# Patient Record
Sex: Male | Born: 1990 | Race: Black or African American | Hispanic: No | Marital: Single | Smoking: Never smoker
Health system: Southern US, Community
[De-identification: ages and names within clinical notes are randomized; demographics above are authoritative.]

## PROBLEM LIST (undated history)

## (undated) DIAGNOSIS — J45909 Unspecified asthma, uncomplicated: Secondary | ICD-10-CM

## (undated) DIAGNOSIS — U071 COVID-19: Secondary | ICD-10-CM

## (undated) HISTORY — DX: Unspecified asthma, uncomplicated: J45.909

---

## 2019-02-01 ENCOUNTER — Other Ambulatory Visit: Payer: Self-pay

## 2019-02-01 ENCOUNTER — Encounter (HOSPITAL_COMMUNITY): Payer: Self-pay

## 2019-02-01 ENCOUNTER — Emergency Department (HOSPITAL_COMMUNITY)
Admission: EM | Admit: 2019-02-01 | Discharge: 2019-02-02 | Disposition: A | Discharge status: Home / Self Care | Payer: 59 | Attending: Emergency Medicine | Admitting: Emergency Medicine

## 2019-02-01 DIAGNOSIS — Z20828 Contact with and (suspected) exposure to other viral communicable diseases: Secondary | ICD-10-CM | POA: Insufficient documentation

## 2019-02-01 DIAGNOSIS — R42 Dizziness and giddiness: Secondary | ICD-10-CM

## 2019-02-01 DIAGNOSIS — B349 Viral infection, unspecified: Secondary | ICD-10-CM | POA: Diagnosis not present

## 2019-02-01 DIAGNOSIS — I951 Orthostatic hypotension: Secondary | ICD-10-CM | POA: Diagnosis not present

## 2019-02-01 LAB — CBC
HCT: 41.8 % (ref 39.0–52.0)
Hemoglobin: 13.5 g/dL (ref 13.0–17.0)
MCH: 29.6 pg (ref 26.0–34.0)
MCHC: 32.3 g/dL (ref 30.0–36.0)
MCV: 91.7 fL (ref 80.0–100.0)
Platelets: 258 10*3/uL (ref 150–400)
RBC: 4.56 MIL/uL (ref 4.22–5.81)
RDW: 13.2 % (ref 11.5–15.5)
WBC: 5.5 10*3/uL (ref 4.0–10.5)
nRBC: 0 % (ref 0.0–0.2)

## 2019-02-01 LAB — URINALYSIS, ROUTINE W REFLEX MICROSCOPIC
Bilirubin Urine: NEGATIVE
Glucose, UA: NEGATIVE mg/dL
Hgb urine dipstick: NEGATIVE
Ketones, ur: NEGATIVE mg/dL
Leukocytes,Ua: NEGATIVE
Nitrite: NEGATIVE
Protein, ur: NEGATIVE mg/dL
Specific Gravity, Urine: 1.009 (ref 1.005–1.030)
pH: 6 (ref 5.0–8.0)

## 2019-02-01 LAB — BASIC METABOLIC PANEL
Anion gap: 11 (ref 5–15)
BUN: 7 mg/dL (ref 6–20)
CO2: 23 mmol/L (ref 22–32)
Calcium: 8.7 mg/dL — ABNORMAL LOW (ref 8.9–10.3)
Chloride: 101 mmol/L (ref 98–111)
Creatinine, Ser: 1.18 mg/dL (ref 0.61–1.24)
GFR calc Af Amer: >60 mL/min (ref >60)
GFR calc non Af Amer: >60 mL/min (ref >60)
Glucose, Bld: 115 mg/dL — ABNORMAL HIGH (ref 70–99)
Potassium: 3.8 mmol/L (ref 3.5–5.1)
Sodium: 135 mmol/L (ref 135–145)

## 2019-02-01 MED ORDER — SODIUM CHLORIDE 0.9% FLUSH: 3.0000 mL | Freq: Once | INTRAVENOUS | Status: DC

## 2019-02-01 NOTE — ED Triage Notes (Signed)
Pt reports recent visit to [facility] on Sunday for rash, pt told he had strept but test was negative, given rx for amoxicillin and ever since pt has felt fatigued, dizzy and has had a dry mouth. Pt taking tylenol and benadryl OTC for symptoms. Pt a.o, nad noted.

## 2019-02-02 NOTE — Discharge Instructions (Addendum)
We have that the outpatient COVID-19 test.  Results will take about 48 hours. Please return to the ER if your symptoms worsen; you have increased pain, fevers, chills, inability to keep any medications down, severe shortness of breath, confusion. Otherwise see the outpatient doctor as requested.   Person Under Monitoring Name: Tim Delgado Iams  Location: [address] [place] [id number]   Infection Prevention Recommendations for Individuals Confirmed to have, or Being Evaluated for, 2019 Novel Coronavirus (COVID-19) Infection Who Receive Care at Home  Individuals who are confirmed to have, or are being evaluated for, COVID-19 should follow the prevention steps below until a healthcare provider or local or state health department says they can return to normal activities.  Stay home except to get medical care You should restrict activities outside your home, except for getting medical care. Do not go to work, school, or public areas, and do not use public transportation or taxis.  Call ahead before visiting your doctor Before your medical appointment, call the healthcare provider and tell them that you have, or are being evaluated for, COVID-19 infection. This will help the healthcare providers office take steps to keep other people from getting infected. Ask your healthcare provider to call the local or state health department.  Monitor your symptoms Seek prompt medical attention if your illness is worsening (e.g., difficulty breathing). Before going to your medical appointment, call the healthcare provider and tell them that you have, or are being evaluated for, COVID-19 infection. Ask your healthcare provider to call the local or state health department.  Wear a facemask You should wear a facemask that covers your nose and mouth when you are in the same room with other people and when you visit a healthcare provider. People who live with or visit you should also  wear a facemask while they are in the same room with you.  Separate yourself from other people in your home As much as possible, you should stay in a different room from other people in your home. Also, you should use a separate bathroom, if available.  Avoid sharing household items You should not share dishes, drinking glasses, cups, eating utensils, towels, bedding, or other items with other people in your home. After using these items, you should wash them thoroughly with soap and water.  Cover your coughs and sneezes Cover your mouth and nose with a tissue when you cough or sneeze, or you can cough or sneeze into your sleeve. Throw used tissues in a lined trash can, and immediately wash your hands with soap and water for at least 20 seconds or use an alcohol-based hand rub.  Wash your Union Pacific Corporationhands Wash your hands often and thoroughly with soap and water for at least 20 seconds. You can use an alcohol-based hand sanitizer if soap and water are not available and if your hands are not visibly dirty. Avoid touching your eyes, nose, and mouth with unwashed hands.   Prevention Steps for Caregivers and Household Members of Individuals Confirmed to have, or Being Evaluated for, COVID-19 Infection Being Cared for in the Home  If you live with, or provide care at home for, a person confirmed to have, or being evaluated for, COVID-19 infection please follow these guidelines to prevent infection:  Follow healthcare providers instructions Make sure that you understand and can help the patient follow any healthcare provider instructions for all care.  Provide for the patients basic needs You should help the patient with basic needs in  the home and provide support for getting groceries, prescriptions, and other personal needs.  Monitor the patients symptoms If they are getting sicker, call his or her medical provider and tell them that the patient has, or is being evaluated for, COVID-19  infection. This will help the healthcare providers office take steps to keep other people from getting infected. Ask the healthcare provider to call the local or state health department.  Limit the number of people who have contact with the patient If possible, have only one caregiver for the patient. Other household members should stay in another home or place of residence. If this is not possible, they should stay in another room, or be separated from the patient as much as possible. Use a separate bathroom, if available. Restrict visitors who do not have an essential need to be in the home.  Keep older adults, very young children, and other sick people away from the patient Keep older adults, very young children, and those who have compromised immune systems or chronic health conditions away from the patient. This includes people with chronic heart, lung, or kidney conditions, diabetes, and cancer.  Ensure good ventilation Make sure that shared spaces in the home have good air flow, such as from an air conditioner or an opened window, weather permitting.  Wash your hands often Wash your hands often and thoroughly with soap and water for at least 20 seconds. You can use an alcohol based hand sanitizer if soap and water are not available and if your hands are not visibly dirty. Avoid touching your eyes, nose, and mouth with unwashed hands. Use disposable paper towels to dry your hands. If not available, use dedicated cloth towels and replace them when they become wet.  Wear a facemask and gloves Wear a disposable facemask at all times in the room and gloves when you touch or have contact with the patients blood, body fluids, and/or secretions or excretions, such as sweat, saliva, sputum, nasal mucus, vomit, urine, or feces.  Ensure the mask fits over your nose and mouth tightly, and do not touch it during use. Throw out disposable facemasks and gloves after using them. Do not reuse. Wash  your hands immediately after removing your facemask and gloves. If your personal clothing becomes contaminated, carefully remove clothing and launder. Wash your hands after handling contaminated clothing. Place all used disposable facemasks, gloves, and other waste in a lined container before disposing them with other household waste. Remove gloves and wash your hands immediately after handling these items.  Do not share dishes, glasses, or other household items with the patient Avoid sharing household items. You should not share dishes, drinking glasses, cups, eating utensils, towels, bedding, or other items with a patient who is confirmed to have, or being evaluated for, COVID-19 infection. After the person uses these items, you should wash them thoroughly with soap and water.  Wash laundry thoroughly Immediately remove and wash clothes or bedding that have blood, body fluids, and/or secretions or excretions, such as sweat, saliva, sputum, nasal mucus, vomit, urine, or feces, on them. Wear gloves when handling laundry from the patient. Read and follow directions on labels of laundry or clothing items and detergent. In general, wash and dry with the warmest temperatures recommended on the label.  Clean all areas the individual has used often Clean all touchable surfaces, such as counters, tabletops, doorknobs, bathroom fixtures, toilets, phones, keyboards, tablets, and bedside tables, every day. Also, clean any surfaces that may have blood, body fluids,  and/or secretions or excretions on them. Wear gloves when cleaning surfaces the patient has come in contact with. Use a diluted bleach solution (e.g., dilute bleach with 1 part bleach and 10 parts water) or a household disinfectant with a label that says EPA-registered for coronaviruses. To make a bleach solution at home, add 1 tablespoon of bleach to 1 quart (4 cups) of water. For a larger supply, add  cup of bleach to 1 gallon (16 cups) of  water. Read labels of cleaning products and follow recommendations provided on product labels. Labels contain instructions for safe and effective use of the cleaning product including precautions you should take when applying the product, such as wearing gloves or eye protection and making sure you have good ventilation during use of the product. Remove gloves and wash hands immediately after cleaning.  Monitor yourself for signs and symptoms of illness Caregivers and household members are considered close contacts, should monitor their health, and will be asked to limit movement outside of the home to the extent possible. Follow the monitoring steps for close contacts listed on the symptom monitoring form.   ? If you have additional questions, contact your local health department or call the epidemiologist on call at [phone] (available 24/7). ? This guidance is subject to change. For the most up-to-date guidance from [facility], please refer to their website: [email or web address]

## 2019-02-02 NOTE — ED Provider Notes (Signed)
[facility] EMERGENCY DEPARTMENT Provider Note   CSN: [id number] Arrival date & time: 02/01/19  2003     History   Chief Complaint Chief Complaint  Patient presents with  . Dizziness  . Medication Reaction    HPI Tim Delgado is a 28 y.o. male.     HPI  28 year old male comes in a chief complaint of dizziness. Patient reports that he is been feeling unwell for 4 days now.  Patient had presented to an urgent care with a rash and he was told that he had a strep infection and started on amoxicillin.  Since then patient has been having body aches, dizziness and feeling fatigued.  He also states that he has been peeing a lot and drinking a lot of water.  He denies any diarrhea, vomiting, anosmia, new rash, sore throat, severe headaches.  He works in [employer] research (medical) but denies any known sick contacts with COVID-19.  History reviewed. No pertinent past medical history.  There are no active problems to display for this patient.   History reviewed. No pertinent surgical history.      Home Medications    Prior to Admission medications   Not on File    Family History No family history on file.  Social History Social History   Tobacco Use  . Smoking status: Not on file  Substance Use Topics  . Alcohol use: Not on file  . Drug use: Not on file     Allergies   Patient has no allergy information on record.   Review of Systems Review of Systems  Constitutional: Positive for activity change, fatigue and fever.  HENT: Positive for sore throat.   Respiratory: Positive for cough. Negative for shortness of breath.   Cardiovascular: Negative for chest pain.  Gastrointestinal: Negative for nausea and vomiting.  Genitourinary: Positive for frequency.  Musculoskeletal: Positive for myalgias.  Allergic/Immunologic: Negative for immunocompromised state.  Hematological: Does not bruise/bleed easily.  All other systems reviewed and are negative.    Physical Exam  Updated Vital Signs BP 110/73 (BP Location: Left Arm)   Pulse 95   Temp (!) 100.7 F (38.2 C) (Oral)   Resp 18   SpO2 97%   Physical Exam Vitals signs and nursing note reviewed.  Constitutional:      Appearance: He is well-developed.  HENT:     Head: Atraumatic.     Mouth/Throat:     Mouth: Mucous membranes are dry.  Neck:     Musculoskeletal: Neck supple.  Cardiovascular:     Rate and Rhythm: Normal rate.  Pulmonary:     Effort: Pulmonary effort is normal.  Abdominal:     Palpations: Abdomen is soft.     Tenderness: There is no abdominal tenderness.  Musculoskeletal:        General: No swelling or tenderness.  Skin:    General: Skin is warm.     Findings: No rash.  Neurological:     Mental Status: He is alert and oriented to person, place, and time.      ED Treatments / Results  Labs (all labs ordered are listed, but only abnormal results are displayed) Labs Reviewed  BASIC METABOLIC PANEL - Abnormal; Notable for the following components:      Result Value   Glucose, Bld 115 (*)    Calcium 8.7 (*)    All other components within normal limits  NOVEL CORONAVIRUS, NAA (HOSPITAL ORDER, SEND-OUT TO REF LAB)  CBC  URINALYSIS, ROUTINE W REFLEX  MICROSCOPIC    EKG None  Radiology No results found.  Procedures Procedures (including critical care time)  Medications Ordered in ED Medications  sodium chloride flush (NS) 0.9 % injection 3 mL (has no administration in time range)     Initial Impression / Assessment and Plan / ED Course  I have reviewed the triage vital signs and the nursing notes.  Pertinent labs & imaging results that were available during my care of the patient were reviewed by me and considered in my medical decision making (see chart for details).        28 year old comes with a chief complaint of dizziness, fatigue, some URI-like symptoms and urinary frequency.  He has a low-grade fever here.  Patient denies any COVID-19 exposures.   He is on amoxicillin for presumed strep throat by urgent care.  Cardiopulmonary exam is normal.  He is appearing slightly dry, but the labs are reassuring.  We will get an outpatient COVID-19 test with this week symptoms along with fevers.   Tim Delgado was evaluated in Emergency Department on 02/02/2019 for the symptoms described in the history of present illness. He was evaluated in the context of the global COVID-19 pandemic, which necessitated consideration that the patient might be at risk for infection with the SARS-CoV-2 virus that causes COVID-19. Institutional protocols and algorithms that pertain to the evaluation of patients at risk for COVID-19 are in a state of rapid change based on information released by regulatory bodies including the CDC and federal and state organizations. These policies and algorithms were followed during the patient's care in the ED.   Final Clinical Impressions(s) / ED Diagnoses   Final diagnoses:  Orthostatic dizziness  Acute viral syndrome    ED Discharge Orders    None       Derwood KaplanNanavati, Vanetta Rule, MD 02/02/19 [phone]

## 2019-02-05 ENCOUNTER — Encounter (HOSPITAL_COMMUNITY): Payer: Self-pay

## 2019-02-05 ENCOUNTER — Emergency Department (HOSPITAL_COMMUNITY): Payer: 59

## 2019-02-05 ENCOUNTER — Emergency Department (HOSPITAL_COMMUNITY)
Admission: EM | Admit: 2019-02-05 | Discharge: 2019-02-05 | Disposition: A | Discharge status: Home / Self Care | Payer: 59 | Attending: Emergency Medicine | Admitting: Emergency Medicine

## 2019-02-05 ENCOUNTER — Other Ambulatory Visit: Payer: Self-pay

## 2019-02-05 DIAGNOSIS — U071 COVID-19: Secondary | ICD-10-CM | POA: Diagnosis not present

## 2019-02-05 DIAGNOSIS — R509 Fever, unspecified: Secondary | ICD-10-CM | POA: Diagnosis present

## 2019-02-05 HISTORY — DX: COVID-19: U07.1

## 2019-02-05 LAB — COMPREHENSIVE METABOLIC PANEL
ALT: 196 U/L — ABNORMAL HIGH (ref 0–44)
AST: 301 U/L — ABNORMAL HIGH (ref 15–41)
Albumin: 4 g/dL (ref 3.5–5.0)
Alkaline Phosphatase: 123 U/L (ref 38–126)
Anion gap: 13 (ref 5–15)
BUN: 15 mg/dL (ref 6–20)
CO2: 25 mmol/L (ref 22–32)
Calcium: 8.6 mg/dL — ABNORMAL LOW (ref 8.9–10.3)
Chloride: 98 mmol/L (ref 98–111)
Creatinine, Ser: 1.15 mg/dL (ref 0.61–1.24)
GFR calc Af Amer: >60 mL/min (ref >60)
GFR calc non Af Amer: >60 mL/min (ref >60)
Glucose, Bld: 121 mg/dL — ABNORMAL HIGH (ref 70–99)
Potassium: 3.8 mmol/L (ref 3.5–5.1)
Sodium: 136 mmol/L (ref 135–145)
Total Bilirubin: 0.5 mg/dL (ref 0.3–1.2)
Total Protein: 8.4 g/dL — ABNORMAL HIGH (ref 6.5–8.1)

## 2019-02-05 LAB — CBC WITH DIFFERENTIAL/PLATELET
Abs Immature Granulocytes: 0.01 10*3/uL (ref 0.00–0.07)
Basophils Absolute: 0 10*3/uL (ref 0.0–0.1)
Basophils Relative: 1 %
Eosinophils Absolute: 0 10*3/uL (ref 0.0–0.5)
Eosinophils Relative: 0 %
HCT: 47.1 % (ref 39.0–52.0)
Hemoglobin: 15.3 g/dL (ref 13.0–17.0)
Immature Granulocytes: 0 %
Lymphocytes Relative: 40 %
Lymphs Abs: 1.4 10*3/uL (ref 0.7–4.0)
MCH: 29.2 pg (ref 26.0–34.0)
MCHC: 32.5 g/dL (ref 30.0–36.0)
MCV: 89.9 fL (ref 80.0–100.0)
Monocytes Absolute: 0.1 10*3/uL (ref 0.1–1.0)
Monocytes Relative: 4 %
Neutro Abs: 1.9 10*3/uL (ref 1.7–7.7)
Neutrophils Relative %: 55 %
Platelets: 164 10*3/uL (ref 150–400)
RBC: 5.24 MIL/uL (ref 4.22–5.81)
RDW: 13.2 % (ref 11.5–15.5)
WBC: 3.4 10*3/uL — ABNORMAL LOW (ref 4.0–10.5)
nRBC: 0 % (ref 0.0–0.2)

## 2019-02-05 LAB — LACTIC ACID, PLASMA: Lactic Acid, Venous: 1.3 mmol/L (ref 0.5–1.9)

## 2019-02-05 MED ORDER — ONDANSETRON 8 MG PO TBDP: 8.0000 mg | ORAL_TABLET | Freq: Three times a day (TID) | ORAL | 0 refills | Status: DC | PRN

## 2019-02-05 MED ORDER — SODIUM CHLORIDE 0.9 % IV BOLUS
2000.0000 mL | Freq: Once | INTRAVENOUS | Status: AC
  Administered 2019-02-05: 2000 mL via INTRAVENOUS

## 2019-02-05 NOTE — Discharge Instructions (Signed)
Your liver function studies were elevated today.  Please avoid taking Tylenol and follow-up with your doctor for repeat blood work in a week.

## 2019-02-05 NOTE — ED Triage Notes (Signed)
Pt was tested for COVID yesterday and results were positive. Pt has a mild fever, body aches, and generalized weakness. Per EMS, pt states he "couldn't walk from being so weak", however, pt walked from his house to the ambulance without assistance, and pt was able to walk from the stretcher to treatment room.

## 2019-02-05 NOTE — ED Provider Notes (Signed)
[facility]-EMERGENCY DEPT Provider Note   CSN: [id number] Arrival date & time: 02/05/19  1524     History   Chief Complaint Chief Complaint  Patient presents with  . Fever    COVID + test yesterday    HPI Tim Delgado is a 28 y.o. male.     28 year old male here presents with fever and chills as well as weakness.  Was diagnosed with COVID-19 on July 9.  Since that time he has had diffuse weakness with cough and some dyspnea.  No emesis or diarrhea.  Has had body aches.  No sore throat.  He has had lack of appetite.  His symptoms have been progressively worse but have not been associated with headache or photophobia.  No treatment use prior to arrival.     Past Medical History:  Diagnosis Date  . COVID-19     There are no active problems to display for this patient.   History reviewed. No pertinent surgical history.      Home Medications    Prior to Admission medications   Not on File    Family History No family history on file.  Social History Social History   Tobacco Use  . Smoking status: Never Smoker  . Smokeless tobacco: Never Used  Substance Use Topics  . Alcohol use: Yes    Alcohol/week: 4.0 standard drinks    Types: 4 Glasses of wine per week  . Drug use: Never     Allergies   Patient has no known allergies.   Review of Systems Review of Systems  All other systems reviewed and are negative.    Physical Exam Updated Vital Signs BP 109/81   Pulse 90   Temp 98.7 F (37.1 C) (Oral)   Resp 18   Ht 1.778 m (5\' 10" )   Wt 74.8 kg   SpO2 99%   BMI 23.68 kg/m   Physical Exam Vitals signs and nursing note reviewed.  Constitutional:      General: He is not in acute distress.    Appearance: Normal appearance. He is well-developed. He is not toxic-appearing.  HENT:     Head: Normocephalic and atraumatic.  Eyes:     General: Lids are normal.     Conjunctiva/sclera: Conjunctivae normal.     Pupils: Pupils are  equal, round, and reactive to light.  Neck:     Musculoskeletal: Normal range of motion and neck supple.     Thyroid: No thyroid mass.     Trachea: No tracheal deviation.  Cardiovascular:     Rate and Rhythm: Normal rate and regular rhythm.     Heart sounds: Normal heart sounds. No murmur. No gallop.   Pulmonary:     Effort: Pulmonary effort is normal. No respiratory distress.     Breath sounds: Normal breath sounds. No stridor. No decreased breath sounds, wheezing, rhonchi or rales.  Abdominal:     General: Bowel sounds are normal. There is no distension.     Palpations: Abdomen is soft.     Tenderness: There is no abdominal tenderness. There is no rebound.  Musculoskeletal: Normal range of motion.        General: No tenderness.  Skin:    General: Skin is warm and dry.     Findings: No abrasion or rash.  Neurological:     Mental Status: He is alert and oriented to person, place, and time.     GCS: GCS eye subscore is 4. GCS verbal subscore  is 5. GCS motor subscore is 6.     Cranial Nerves: No cranial nerve deficit.     Sensory: No sensory deficit.  Psychiatric:        Speech: Speech normal.        Behavior: Behavior normal.      ED Treatments / Results  Labs (all labs ordered are listed, but only abnormal results are displayed) Labs Reviewed  COMPREHENSIVE METABOLIC PANEL - Abnormal; Notable for the following components:      Result Value   Glucose, Bld 121 (*)    Calcium 8.6 (*)    Total Protein 8.4 (*)    AST 301 (*)    ALT 196 (*)    All other components within normal limits  CBC WITH DIFFERENTIAL/PLATELET - Abnormal; Notable for the following components:   WBC 3.4 (*)    All other components within normal limits  LACTIC ACID, PLASMA  LACTIC ACID, PLASMA  URINALYSIS, ROUTINE W REFLEX MICROSCOPIC    EKG None  Radiology Dg Chest Port 1 View  Result Date: 02/05/2019 CLINICAL DATA:  Patient is positive for COVID-19.  Fever. EXAM: PORTABLE CHEST 1 VIEW  COMPARISON:  None. FINDINGS: The heart size and mediastinal contours are within normal limits. Both lungs are clear. The visualized skeletal structures are unremarkable. IMPRESSION: No active disease. Electronically Signed   By: Dorise Bullion III M.D   On: 02/05/2019 17:19    Procedures Procedures (including critical care time)  Medications Ordered in ED Medications  sodium chloride 0.9 % bolus 2,000 mL (2,000 mLs Intravenous New Bag/Given 02/05/19 1630)     Initial Impression / Assessment and Plan / ED Course  I have reviewed the triage vital signs and the nursing notes.  Pertinent labs & imaging results that were available during my care of the patient were reviewed by me and considered in my medical decision making (see chart for details).       Patient given IV fluids here and does feel better.  His laboratory studies were noted for a mild transaminitis.  Chest x-ray without evidence of infiltrate.  Patient instructed to follow-up with his doctor as needed concerning his liver function studies and will prescribe some antiemetics.  Final Clinical Impressions(s) / ED Diagnoses   Final diagnoses:  None    ED Discharge Orders    None       Lacretia Leigh, MD 02/05/19 [phone]

## 2019-02-06 LAB — NOVEL CORONAVIRUS, NAA (HOSP ORDER, SEND-OUT TO REF LAB; TAT 18-24 HRS): SARS-CoV-2, NAA: DETECTED — AB

## 2019-02-22 ENCOUNTER — Emergency Department (HOSPITAL_COMMUNITY): Payer: 59

## 2019-02-22 ENCOUNTER — Encounter (HOSPITAL_COMMUNITY): Payer: Self-pay | Admitting: Emergency Medicine

## 2019-02-22 ENCOUNTER — Emergency Department (HOSPITAL_COMMUNITY)
Admission: EM | Admit: 2019-02-22 | Discharge: 2019-02-22 | Disposition: A | Discharge status: Home / Self Care | Payer: 59 | Attending: Emergency Medicine | Admitting: Emergency Medicine

## 2019-02-22 ENCOUNTER — Other Ambulatory Visit: Payer: Self-pay

## 2019-02-22 DIAGNOSIS — U071 COVID-19: Secondary | ICD-10-CM | POA: Insufficient documentation

## 2019-02-22 DIAGNOSIS — R5381 Other malaise: Secondary | ICD-10-CM | POA: Diagnosis not present

## 2019-02-22 LAB — CBC
HCT: 43.9 % (ref 39.0–52.0)
Hemoglobin: 13.6 g/dL (ref 13.0–17.0)
MCH: 28.8 pg (ref 26.0–34.0)
MCHC: 31 g/dL (ref 30.0–36.0)
MCV: 92.8 fL (ref 80.0–100.0)
Platelets: 228 10*3/uL (ref 150–400)
RBC: 4.73 MIL/uL (ref 4.22–5.81)
RDW: 14.1 % (ref 11.5–15.5)
WBC: 12 10*3/uL — ABNORMAL HIGH (ref 4.0–10.5)
nRBC: 0 % (ref 0.0–0.2)

## 2019-02-22 LAB — COMPREHENSIVE METABOLIC PANEL
ALT: 90 U/L — ABNORMAL HIGH (ref 0–44)
AST: 74 U/L — ABNORMAL HIGH (ref 15–41)
Albumin: 3.6 g/dL (ref 3.5–5.0)
Alkaline Phosphatase: 136 U/L — ABNORMAL HIGH (ref 38–126)
Anion gap: 9 (ref 5–15)
BUN: 8 mg/dL (ref 6–20)
CO2: 26 mmol/L (ref 22–32)
Calcium: 8.7 mg/dL — ABNORMAL LOW (ref 8.9–10.3)
Chloride: 102 mmol/L (ref 98–111)
Creatinine, Ser: 0.86 mg/dL (ref 0.61–1.24)
GFR calc Af Amer: >60 mL/min (ref >60)
GFR calc non Af Amer: >60 mL/min (ref >60)
Glucose, Bld: 96 mg/dL (ref 70–99)
Potassium: 4.4 mmol/L (ref 3.5–5.1)
Sodium: 137 mmol/L (ref 135–145)
Total Bilirubin: 0.7 mg/dL (ref 0.3–1.2)
Total Protein: 8.1 g/dL (ref 6.5–8.1)

## 2019-02-22 MED ORDER — ALBUTEROL SULFATE HFA 108 (90 BASE) MCG/ACT IN AERS
2.0000 | INHALATION_SPRAY | RESPIRATORY_TRACT | Status: DC | PRN
  Administered 2019-02-22: 17:00:00 2 via RESPIRATORY_TRACT
  Filled 2019-02-22: qty 6.7

## 2019-02-22 MED ORDER — SODIUM CHLORIDE 0.9 % IV BOLUS: 1000.0000 mL | Freq: Once | INTRAVENOUS | Status: DC

## 2019-02-22 NOTE — Discharge Instructions (Signed)
It was our pleasure to provide your ER care today - we hope that you feel better.  Rest. Drink plenty of fluids.   Follow up with primary care doctor, and/or occupational medicine doctor for your employer, should you require additional disability paperwork completed.   From today's lab tests, your liver function tests are elevated, but improved from prior - follow up with primary care doctor.   Return to ER if worse, new symptoms, high fevers, increased trouble breathing, new or severe pain, other concern.

## 2019-02-22 NOTE — ED Notes (Signed)
Pt reports that he had an inhaler last time and asking about another one.  Messaged Dr Ashok Cordia about inhaler. Awaiting new orders.

## 2019-02-22 NOTE — ED Triage Notes (Signed)
pt reports he was Covid + 14 days ago but still having SOB and chest pains and not feeling good.

## 2019-02-22 NOTE — ED Provider Notes (Signed)
[facility] DEPT Provider Note   CSN: [id number] Arrival date & time: 02/22/19  1105     History   Chief Complaint Chief Complaint  Patient presents with  . Shortness of Breath  . Chest Pain    HPI Tim Delgado is a 28 y.o. male.     Patient states tested positive for COVID 7/12, and states not feeling well yet. C/o body aches, non productive cough, sob w exertion, chest soreness w coughing, poor appetite/relatively poor po intake. Symptoms acute onset, moderate, waxing and waning, persistent, without acute/abrupt worsening today. Denies headache. No neck pain or stiffness. No exertional cp. No pleuritic cp. No abd pain. No vomiting or diarrhea. No dysuria or gu c/o. No rash. States told his work he still wasn't feeling well, and they gave him an extra week off, but now needs additional disability paper work filled out.   The history is provided by the patient.  Shortness of Breath Associated symptoms: cough   Associated symptoms: no abdominal pain, no fever, no headaches, no neck pain, no rash, no sore throat and no vomiting   Chest Pain Associated symptoms: cough and shortness of breath   Associated symptoms: no abdominal pain, no fever, no headache, no palpitations and no vomiting     Past Medical History:  Diagnosis Date  . COVID-19     There are no active problems to display for this patient.   History reviewed. No pertinent surgical history.      Home Medications    Prior to Admission medications   Medication Sig Start Date End Date Taking? Authorizing Provider  ondansetron (ZOFRAN ODT) 8 MG disintegrating tablet Take 1 tablet (8 mg total) by mouth every 8 (eight) hours as needed for nausea or vomiting. 02/05/19   Lacretia Leigh, MD    Family History No family history on file.  Social History Social History   Tobacco Use  . Smoking status: Never Smoker  . Smokeless tobacco: Never Used  Substance Use Topics  . Alcohol  use: Yes    Alcohol/week: 4.0 standard drinks    Types: 4 Glasses of wine per week  . Drug use: Never     Allergies   Patient has no known allergies.   Review of Systems Review of Systems  Constitutional: Negative for fever.  HENT: Negative for sore throat.   Eyes: Negative for redness.  Respiratory: Positive for cough and shortness of breath.   Cardiovascular: Negative for palpitations and leg swelling.  Gastrointestinal: Negative for abdominal pain, diarrhea and vomiting.  Endocrine: Negative for polyuria.  Genitourinary: Negative for dysuria and flank pain.  Musculoskeletal: Negative for neck pain and neck stiffness.  Skin: Negative for rash.  Neurological: Negative for headaches.  Hematological: Does not bruise/bleed easily.  Psychiatric/Behavioral: Negative for confusion.     Physical Exam Updated Vital Signs BP 129/89 (BP Location: Right Arm)   Pulse (!) 110   Temp 98.3 F (36.8 C) (Oral)   Resp 20   SpO2 98%   Physical Exam Vitals signs and nursing note reviewed.  Constitutional:      Appearance: Normal appearance. He is well-developed.  HENT:     Head: Atraumatic.     Nose: Nose normal.     Mouth/Throat:     Mouth: Mucous membranes are moist.     Pharynx: Oropharynx is clear.  Eyes:     General: No scleral icterus.    Conjunctiva/sclera: Conjunctivae normal.     Pupils: Pupils are equal,  round, and reactive to light.  Neck:     Musculoskeletal: Normal range of motion and neck supple. No neck rigidity.     Trachea: No tracheal deviation.     Comments: No stiffness or rigidity.  Cardiovascular:     Rate and Rhythm: Regular rhythm. Tachycardia present.     Pulses: Normal pulses.     Heart sounds: Normal heart sounds. No murmur. No friction rub. No gallop.   Pulmonary:     Effort: Pulmonary effort is normal. No accessory muscle usage or respiratory distress.     Breath sounds: Normal breath sounds.  Chest:     Chest wall: Tenderness present.   Abdominal:     General: Bowel sounds are normal. There is no distension.     Palpations: Abdomen is soft. There is no mass.     Tenderness: There is no abdominal tenderness. There is no guarding or rebound.     Hernia: No hernia is present.  Genitourinary:    Comments: No cva tenderness. Musculoskeletal:        General: No swelling or tenderness.     Right lower leg: No edema.     Left lower leg: No edema.  Skin:    General: Skin is warm and dry.     Findings: No rash.  Neurological:     Mental Status: He is alert.     Comments: Alert, speech clear. Motor intact bil, stre 5/5. sens grossly intact. Steady gait.   Psychiatric:        Mood and Affect: Mood normal.      ED Treatments / Results  Labs (all labs ordered are listed, but only abnormal results are displayed) Results for orders placed or performed during the hospital encounter of 02/22/19  CBC  Result Value Ref Range   WBC 12.0 (H) 4.0 - 10.5 K/uL   RBC 4.73 4.22 - 5.81 MIL/uL   Hemoglobin 13.6 13.0 - 17.0 g/dL   HCT 82.943.9 56.239.0 - 13.052.0 %   MCV 92.8 80.0 - 100.0 fL   MCH 28.8 26.0 - 34.0 pg   MCHC 31.0 30.0 - 36.0 g/dL   RDW 86.514.1 78.411.5 - 69.615.5 %   Platelets 228 150 - 400 K/uL   nRBC 0.0 0.0 - 0.2 %  Comprehensive metabolic panel  Result Value Ref Range   Sodium 137 135 - 145 mmol/L   Potassium 4.4 3.5 - 5.1 mmol/L   Chloride 102 98 - 111 mmol/L   CO2 26 22 - 32 mmol/L   Glucose, Bld 96 70 - 99 mg/dL   BUN 8 6 - 20 mg/dL   Creatinine, Ser 2.950.86 0.61 - 1.24 mg/dL   Calcium 8.7 (L) 8.9 - 10.3 mg/dL   Total Protein 8.1 6.5 - 8.1 g/dL   Albumin 3.6 3.5 - 5.0 g/dL   AST 74 (H) 15 - 41 U/L   ALT 90 (H) 0 - 44 U/L   Alkaline Phosphatase 136 (H) 38 - 126 U/L   Total Bilirubin 0.7 0.3 - 1.2 mg/dL   GFR calc non Af Amer >60 >60 mL/min   GFR calc Af Amer >60 >60 mL/min   Anion gap 9 5 - 15   Dg Chest Port 1 View  Result Date: 02/22/2019 CLINICAL DATA:  Shortness of breath and cough EXAM: PORTABLE CHEST 1 VIEW  COMPARISON:  February 05, 2019 FINDINGS: The lungs are clear. Heart size and pulmonary vascularity are normal. No adenopathy. There is lower thoracic dextroscoliosis. IMPRESSION: No edema or  consolidation.  No evident adenopathy. Electronically Signed   By: Bretta BangWilliam  Woodruff III M.D.   On: 02/22/2019 13:13   Dg Chest Port 1 View  Result Date: 02/05/2019 CLINICAL DATA:  Patient is positive for COVID-19.  Fever. EXAM: PORTABLE CHEST 1 VIEW COMPARISON:  None. FINDINGS: The heart size and mediastinal contours are within normal limits. Both lungs are clear. The visualized skeletal structures are unremarkable. IMPRESSION: No active disease. Electronically Signed   By: Gerome Samavid  Williams III M.D   On: 02/05/2019 17:19    EKG None  [facility]  Result Date: 02/22/2019 CLINICAL DATA:  Shortness of breath and cough EXAM: PORTABLE CHEST 1 VIEW COMPARISON:  February 05, 2019 FINDINGS: The lungs are clear. Heart size and pulmonary vascularity are normal. No adenopathy. There is lower thoracic dextroscoliosis. IMPRESSION: No edema or consolidation.  No evident adenopathy. Electronically Signed   By: Bretta BangWilliam  Woodruff III M.D.   On: 02/22/2019 13:13    Procedures Procedures (including critical care time)  Medications Ordered in ED Medications  sodium chloride 0.9 % bolus 1,000 mL (has no administration in time range)     Initial Impression / Assessment and Plan / ED Course  I have reviewed the triage vital signs and the nursing notes.  Pertinent labs & imaging results that were available during my care of the patient were reviewed by me and considered in my medical decision making (see chart for details).  Iv ns bolus. Labs sent.   Reviewed nursing notes and prior charts for additional history.   Tim Delgado was evaluated in Emergency Department on 02/22/2019 for the symptoms described in the history of present illness. He was evaluated in the context of the global COVID-19 pandemic, which  necessitated consideration that the patient might be at risk for infection with the SARS-CoV-2 virus that causes COVID-19. Institutional protocols and algorithms that pertain to the evaluation of patients at risk for COVID-19 are in a state of rapid change based on information released by regulatory bodies including the CDC and federal and state organizations. These policies and algorithms were followed during the patient's care in the ED.  Labs reviewed by me - ast/alt less elevated than prior. abd soft nt.   cxr reviewed by me - no pna.   Recheck pt. Hr 88. rr 16. Pulse ox 99% room air.  No increased wob.   Pt currently appears stable for d/c.   Discussed need f/u occ med doctor or pcp should he required further disability paperwork.   Return precautions discussed.     Final Clinical Impressions(s) / ED Diagnoses   Final diagnoses:  None    ED Discharge Orders    None       Cathren LaineSteinl, Layla Gramm, MD 02/22/19 [phone]

## 2019-02-22 NOTE — ED Notes (Signed)
Pt refused IV fluids. States that he has been taking in good amount of PO fluids.

## 2019-03-02 ENCOUNTER — Telehealth: Payer: Self-pay | Admitting: Family Medicine

## 2019-03-02 ENCOUNTER — Encounter: Payer: Self-pay | Admitting: Family Medicine

## 2019-03-02 NOTE — Telephone Encounter (Signed)
Form placed in Dr. Volanda Napoleon folder to complete

## 2019-03-02 NOTE — Telephone Encounter (Signed)
Patient dropped of FMLA forms  Fax forms to: [phone]  ATTN: [employer] disability  Disposition: Dr's [employer]

## 2019-03-03 ENCOUNTER — Other Ambulatory Visit: Payer: Self-pay

## 2019-03-03 ENCOUNTER — Telehealth (INDEPENDENT_AMBULATORY_CARE_PROVIDER_SITE_OTHER): Payer: 59 | Admitting: Family Medicine

## 2019-03-03 ENCOUNTER — Encounter: Payer: Self-pay | Admitting: Family Medicine

## 2019-03-03 DIAGNOSIS — Z8709 Personal history of other diseases of the respiratory system: Secondary | ICD-10-CM

## 2019-03-03 DIAGNOSIS — Z7689 Persons encountering health services in other specified circumstances: Secondary | ICD-10-CM

## 2019-03-03 DIAGNOSIS — R945 Abnormal results of liver function studies: Secondary | ICD-10-CM

## 2019-03-03 DIAGNOSIS — J45909 Unspecified asthma, uncomplicated: Secondary | ICD-10-CM | POA: Insufficient documentation

## 2019-03-03 DIAGNOSIS — U071 COVID-19: Secondary | ICD-10-CM

## 2019-03-03 DIAGNOSIS — G4700 Insomnia, unspecified: Secondary | ICD-10-CM | POA: Diagnosis not present

## 2019-03-03 DIAGNOSIS — R7989 Other specified abnormal findings of blood chemistry: Secondary | ICD-10-CM

## 2019-03-03 MED ORDER — HYDROXYZINE HCL 50 MG PO TABS: 50.0000 mg | ORAL_TABLET | Freq: Every evening | ORAL | 0 refills | Status: DC | PRN

## 2019-03-03 NOTE — Progress Notes (Signed)
Virtual Visit via Video Note  I connected with Tim Delgado on 03/03/19 at 10:00 AM EDT by a video enabled telemedicine application 2/2 [id number]-47 pandemic and verified that I am speaking with the correct person using two identifiers.  Location patient: home Location provider:work or home office Persons participating in the virtual visit: patient, provider  I discussed the limitations of evaluation and management by telemedicine and the availability of in person appointments. The patient expressed understanding and agreed to proceed.   HPI: Pt is a 28 yo male with pmh sig for asthma.  Previously seen in [place], [place].  COVID-19 infection: -started feeling bad 7/13, tested positive on 7/19.  Seen in ED x 3. -Symptoms included rash, fever HA, n/v, diarrhea, cough, fatigue, SOB, night sweats -Pt states he still feels "bad", fatigue, weakness.  Notes more at night. -still having some SOB with activity -using albuterol inhaler -unsure if fever has resolved as having night sweats -was taking tylenol but had to stop 2/2 elevated LFTs in ED -pt is unsure of sick contacts -pt has FMLA papers.  Has been out of work >2 wks.  Insomnia: -states since getting COVID has difficulty with sleeping -Tried melatonin 10 mg took 2-3 pills, camomile tea, sleep mist. 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  States is mostly up moving around the store when at work.  Pt denies tobacco or drug.  Endorses social alcohol use.  Family Hx: Mom-DM, HTN, open heart surgery Dad- HLD 2 sisters- AAW  granpa MGF-Lung CA, tobacco use. PGM-bone cancer, ovarian cancer  ROS: See pertinent positives and negatives  per HPI.  Past Medical History:  Diagnosis Date  . COVID-19     No past surgical history on file.  No family history on file.   Current Outpatient Medications:  .  ondansetron (ZOFRAN ODT) 8 MG disintegrating tablet, Take 1 tablet (8 mg total) by mouth every 8 (eight) hours as needed for nausea or vomiting., Disp: 20 tablet, Rfl: 0  EXAM:  VITALS per patient if applicable:  GENERAL: alert, oriented, appears well and in no acute distress  HEENT: atraumatic, conjunctiva clear, no obvious abnormalities on inspection of external nose and ears  NECK: normal movements of the head and neck  LUNGS: on inspection no signs of respiratory distress, breathing rate appears normal, no obvious gross SOB, gasping or wheezing  CV: no obvious cyanosis  MS: moves all visible extremities without noticeable abnormality  PSYCH/NEURO: pleasant and cooperative, no obvious depression or anxiety, speech and thought processing grossly intact  ASSESSMENT AND PLAN:  Discussed the following assessment and plan:  COVID-19 virus infection  -Improving -Continue supportive care -Advised to limit/avoid Tylenol use 2/2 increasing LFTs -Consider using ice packs for fever and other supportive care measures -Given precautions -We will complete FMLA papers as patient unable to perform job functions 2/2 fatigue and SOB -Patient advised to follow-up in 1 to 2 weeks to reassess  Insomnia, unspecified type -Likely 2/2 recent illness -Reviewed sleep hygiene - Plan: hydrOXYzine (ATARAX/VISTARIL) 50 MG tablet  History of asthma  -Continue albuterol inhaler as needed  Elevated LFTs -Improving -Likely 2/2 from increased Tylenol use during COVID infection -AST 74, ALT 90,  alk phos 136 on 02/22/2019.  Previously AST 301 and ALT 196 on 02/05/19. -We will continue to monitor  Encounter to establish care  -We reviewed the PMH, PSH, FH, SH, Meds and Allergies. -We provided refills for any medications we will  prescribe as needed. -We addressed current concerns per orders and patient instructions. -We have asked for records for pertinent exams, studies, vaccines and notes from previous providers. -We have advised patient to follow up per instructions below.  F/u in 1-2 wks    I discussed the assessment and treatment plan with the patient. The patient was provided an opportunity to ask questions and all were answered. The patient agreed with the plan and demonstrated an understanding of the instructions.   The patient was advised to call back or seek an in-person evaluation if the symptoms worsen or if the condition fails to improve as anticipated.   Billie Ruddy, MD

## 2019-03-07 ENCOUNTER — Encounter: Payer: Self-pay | Admitting: Family Medicine

## 2019-03-07 DIAGNOSIS — Z0279 Encounter for issue of other medical certificate: Secondary | ICD-10-CM

## 2019-03-09 NOTE — Telephone Encounter (Signed)
pls fu with pt, not received a response so this involves his income, pls fu with him asap at [phone]

## 2019-03-14 ENCOUNTER — Encounter: Payer: Self-pay | Admitting: Family Medicine

## 2019-03-14 ENCOUNTER — Ambulatory Visit (INDEPENDENT_AMBULATORY_CARE_PROVIDER_SITE_OTHER): Payer: 59 | Admitting: Family Medicine

## 2019-03-14 ENCOUNTER — Other Ambulatory Visit: Payer: Self-pay

## 2019-03-14 DIAGNOSIS — G4700 Insomnia, unspecified: Secondary | ICD-10-CM

## 2019-03-14 DIAGNOSIS — U071 COVID-19: Secondary | ICD-10-CM

## 2019-03-14 DIAGNOSIS — R5381 Other malaise: Secondary | ICD-10-CM | POA: Diagnosis not present

## 2019-03-14 MED ORDER — HYDROXYZINE HCL 50 MG PO TABS: 50.0000 mg | ORAL_TABLET | Freq: Every evening | ORAL | 1 refills | Status: DC | PRN

## 2019-03-14 NOTE — Progress Notes (Signed)
Virtual Visit via Video Note  I connected with Tim Delgado on 03/14/19 at  2:30 PM EDT by a video enabled telemedicine application 2/2 [id number]-42 pandemic and verified that I am speaking with the correct person using two identifiers.  Location patient: home Location provider:work or home office Persons participating in the virtual visit: patient, provider  I discussed the limitations of evaluation and management by telemedicine and the availability of in person appointments. The patient expressed understanding and agreed to proceed.   HPI: Pt recovering from COVID infection.  Symptoms started 7/13, positive test 7/19.  States energy is slowly improving, states each day is different.  Pt notes trying to move around more/increase physical activity. Still having some chest tightness/SOB at night, sleeping propped up and using albuterol prn.  Trying to walk up stairs and in the parking lot of his apt complex.  Pt is afebrile.  Still having night sweats and HAs.  Avoiding tylenol 2/2 elevated LFTs from overuse when symptoms started.  States hydroxyzine has helped with sleep and anxiety.  Pt's job is being understanding.  ROS: See pertinent positives and negatives per HPI.  Past Medical History:  Diagnosis Date  . COVID-19     No past surgical history on file.  No family history on file.  SOCIAL HX: pt is a Freight forwarder at [employer] in [place].   Current Outpatient Medications:  .  hydrOXYzine (ATARAX/VISTARIL) 50 MG tablet, Take 1 tablet (50 mg total) by mouth at bedtime as needed., Disp: 30 tablet, Rfl: 0 .  ondansetron (ZOFRAN ODT) 8 MG disintegrating tablet, Take 1 tablet (8 mg total) by mouth every 8 (eight) hours as needed for nausea or vomiting., Disp: 20 tablet, Rfl: 0  EXAM:  VITALS per patient if applicable: RR between 70-62 bpm  GENERAL: alert, oriented, appears well and in no acute distress  HEENT: atraumatic, conjunctiva clear, no obvious abnormalities on inspection of  external nose and ears  NECK: normal movements of the head and neck  LUNGS: on inspection no signs of respiratory distress, breathing rate appears normal, no obvious gross SOB, gasping or wheezing  CV: no obvious cyanosis  MS: moves all visible extremities without noticeable abnormality  PSYCH/NEURO: pleasant and cooperative, no obvious depression or anxiety, speech and thought processing grossly intact  ASSESSMENT AND PLAN:  Discussed the following assessment and plan:  COVID-19 virus infection  -recovering, stable -continue supportive care as needed -Advised to avoid tylenol given elevated LFTs from initial tylenol overuse -FMLA forms completed after last visit. -given precautions -will f/u in 1-2 wks to reassess   Insomnia, unspecified type  - Plan: hydrOXYzine (ATARAX/VISTARIL) 50 MG tablet  Deconditioning -pt encouraged to continue walking daily, setting small goals   I discussed the assessment and treatment plan with the patient. The patient was provided an opportunity to ask questions and all were answered. The patient agreed with the plan and demonstrated an understanding of the instructions.  The patient was advised to call back or seek an in-person evaluation if the symptoms worsen or if the condition fails to improve as anticipated.  Billie Ruddy, MD

## 2019-03-15 ENCOUNTER — Encounter: Payer: Self-pay | Admitting: Family Medicine

## 2019-03-16 ENCOUNTER — Encounter: Payer: Self-pay | Admitting: Family Medicine

## 2019-03-21 ENCOUNTER — Telehealth: Payer: Self-pay

## 2019-03-21 NOTE — Telephone Encounter (Signed)
Spoke with pt states that [employer] needs more information regarding his FMLA paperwork,Pt also scanned in the list of information [employer] needs, advised pt that Dr Volanda Napoleon will be in the office in the morning and that she will review the forms and should let him know when paperwork has been faxed, pt verbalized understanding

## 2019-03-24 NOTE — Telephone Encounter (Signed)
Spoke with pt states that [employer] needs more information regarding his FMLA paperwork,Pt also scanned in the list of information [employer] needs, advised pt that Dr Banks will be in the office in the morning and that she will review the forms and should let him know when paperwork has been faxed, pt verbalized understanding 

## 2019-03-24 NOTE — Telephone Encounter (Signed)
Pt additional information was faxed to [employer], pt was notified

## 2019-03-27 ENCOUNTER — Encounter: Payer: Self-pay | Admitting: Family Medicine

## 2019-03-31 ENCOUNTER — Ambulatory Visit (INDEPENDENT_AMBULATORY_CARE_PROVIDER_SITE_OTHER): Payer: 59 | Admitting: Family Medicine

## 2019-03-31 ENCOUNTER — Other Ambulatory Visit: Payer: Self-pay

## 2019-03-31 ENCOUNTER — Encounter: Payer: Self-pay | Admitting: Family Medicine

## 2019-03-31 DIAGNOSIS — M940 Chondrocostal junction syndrome [Tietze]: Secondary | ICD-10-CM

## 2019-03-31 DIAGNOSIS — U071 COVID-19: Secondary | ICD-10-CM

## 2019-03-31 DIAGNOSIS — R5382 Chronic fatigue, unspecified: Secondary | ICD-10-CM | POA: Diagnosis not present

## 2019-03-31 NOTE — Progress Notes (Signed)
Virtual Visit via Video Note  I connected with Tim Delgado on 03/31/19 at 11:30 AM EDT by a video enabled telemedicine application 2/2 [id number] pandemic and verified that I am speaking with the correct person using two identifiers.  Location patient: home Location provider:work or home office Persons participating in the virtual visit: patient, provider  I discussed the limitations of evaluation and management by telemedicine and the availability of in person appointments. The patient expressed understanding and agreed to proceed.   HPI: Pt still recovering from COVID-19, dx'd 02/05/19.  Still increasing physcial activity.  Tried to walk 2 laps at a [place] track, was exhausted when got back home.  Appetite is descent staying hydrated.  Trying to eat more fruits and vegetables.  Pt exhausted by the end of the day.  Has an occasional migraines, cough, achyness in chest and congestion.  Will randomly have elevated HR which makes his breathing "more challenging".  Feeling also happens at night.   Social Hx:  Pt is a Freight forwarder at [employer].  ROS: See pertinent positives and negatives per HPI.  Past Medical History:  Diagnosis Date  . COVID-19     No past surgical history on file.  No family history on file.    Current Outpatient Medications:  .  hydrOXYzine (ATARAX/VISTARIL) 50 MG tablet, Take 1 tablet (50 mg total) by mouth at bedtime as needed., Disp: 30 tablet, Rfl: 1 .  ondansetron (ZOFRAN ODT) 8 MG disintegrating tablet, Take 1 tablet (8 mg total) by mouth every 8 (eight) hours as needed for nausea or vomiting., Disp: 20 tablet, Rfl: 0  EXAM:  VITALS per patient if applicable: RR between 35-57 bpm  GENERAL: alert, oriented, appears well and in no acute distress  HEENT: atraumatic, conjunctiva clear, no obvious abnormalities on inspection of external nose and ears  NECK: normal movements of the head and neck  LUNGS: on inspection no signs of respiratory distress, breathing  rate appears normal, no obvious gross SOB, gasping or wheezing  CV: no obvious cyanosis  MS: TTP of R sternum when pt pushes in that area.   Moves all visible extremities without noticeable abnormality  PSYCH/NEURO: pleasant and cooperative, no obvious depression or anxiety, speech and thought processing grossly intact  ASSESSMENT AND PLAN:  Discussed the following assessment and plan:  COVID-19 virus infection -symptoms improving slowly -continue to work on increasing physical activity slowly -continue supportive care. -given precautions regarding SOB -will complete forms as needed for pt' employer  Chronic fatigue -2/2 COVID-19 infeciton -continue increasing physical activity as tolerated.  Costochondritis -supportive care: tylenol, heat, massage  F/u in 1-2 wks, sooner if needed.   I discussed the assessment and treatment plan with the patient. The patient was provided an opportunity to ask questions and all were answered. The patient agreed with the plan and demonstrated an understanding of the instructions.   The patient was advised to call back or seek an in-person evaluation if the symptoms worsen or if the condition fails to improve as anticipated.   Billie Ruddy, MD

## 2019-04-05 ENCOUNTER — Ambulatory Visit: Payer: 59 | Admitting: Family Medicine

## 2019-04-06 ENCOUNTER — Encounter: Payer: Self-pay | Admitting: Family Medicine

## 2019-04-06 ENCOUNTER — Telehealth: Payer: Self-pay

## 2019-04-06 NOTE — Telephone Encounter (Signed)
Copied from [place] [phone]. Topic: General - Call Back - No Documentation >> Apr 04, 2019  3:28 PM Erick Blinks wrote: Reason for CRM: Pt requesting call back from nurse regarding short term disability paperwork.  Best contact: [phone]

## 2019-04-07 ENCOUNTER — Telehealth: Payer: Self-pay

## 2019-04-07 NOTE — Telephone Encounter (Signed)
Copied from [place] [phone]. Topic: General - Other >> Mar 20, 2019 12:10 PM Alanda Slim E wrote: Reason for CRM: Pt is trying to get approval for his disability and sent mychart messages to Dr. Volanda Napoleon about the additional form that is needed. This form is needed back to the fax number on the document by tomorrow and Pt would like a call when it has been sent/ please advise

## 2019-04-07 NOTE — Telephone Encounter (Signed)
Prior forms completed.  No new forms received by this provider.

## 2019-04-07 NOTE — Telephone Encounter (Signed)
Spoke with pt states that he discussed with Dr Volanda Napoleon that his employer needs more information on his FMLA form, advised pt I will call him with Dr Volanda Napoleon advise, pt verbalized understanding    Copied from [place] [phone]. Topic: General - Other >> Apr 05, 2019  2:05 PM Pauline Good wrote: Reason for CRM: pt calling back today concerning message from yesterday about his disability paper work. Please call pt to advise

## 2019-04-10 ENCOUNTER — Encounter: Payer: Self-pay | Admitting: Family Medicine

## 2019-04-10 NOTE — Telephone Encounter (Signed)
Spoke with pt advised to contact [employer] to fax the office a copy of pt FMLA form with specifics on the additional information needed.

## 2019-04-11 NOTE — Telephone Encounter (Signed)
Spoke with pt advised to provide a copy of the FMLA form that needs addational information.

## 2019-04-11 NOTE — Telephone Encounter (Deleted)
Advised pt to take the prescription sent to her pharmacy by Dr Volanda Napoleon and then start on her OTC vit D

## 2019-04-13 ENCOUNTER — Encounter: Payer: Self-pay | Admitting: Family Medicine

## 2019-04-14 NOTE — Telephone Encounter (Signed)
Pt FMLA paperwork has been completed and faxed back to [employer] with additional information,pt is aware

## 2019-04-18 ENCOUNTER — Encounter: Payer: Self-pay | Admitting: Family Medicine

## 2019-04-21 ENCOUNTER — Encounter: Payer: Self-pay | Admitting: Family Medicine

## 2019-04-21 ENCOUNTER — Telehealth: Payer: 59 | Admitting: Family

## 2019-04-21 DIAGNOSIS — J029 Acute pharyngitis, unspecified: Secondary | ICD-10-CM

## 2019-04-21 MED ORDER — IBUPROFEN 600 MG PO TABS: 600.0000 mg | ORAL_TABLET | Freq: Three times a day (TID) | ORAL | 0 refills | Status: DC | PRN

## 2019-04-21 MED ORDER — LIDOCAINE VISCOUS HCL 2 % MT SOLN: 15.0000 mL | OROMUCOSAL | 0 refills | Status: DC | PRN

## 2019-04-21 NOTE — Progress Notes (Signed)
We are sorry that you are not feeling well.  Here is how we plan to help!  Your symptoms indicate a likely viral infection (Pharyngitis).   Pharyngitis is inflammation in the back of the throat which can cause a sore throat, scratchiness and sometimes difficulty swallowing.   Pharyngitis is typically caused by a respiratory virus and will just run its course.  Please keep in mind that your symptoms could last up to 10 days.    I have prescribed 2% Viscous Lidocaine 5 ml gargle and swallow every 4 hours as needed for throat pain and Ibuprofen 600 mg #28 four times a day for 7 days if needed for pain and or fever control.  For throat pain, we recommend over the counter oral pain relief medications such as acetaminophen or aspirin, or anti-inflammatory medications such as ibuprofen or naproxen sodium.  Topical treatments such as oral throat lozenges or sprays may be used as needed.  Avoid close contact with loved ones, especially the very young and elderly.  Remember to wash your hands thoroughly throughout the day as this is the number one way to prevent the spread of infection and wipe down door knobs and counters with disinfectant.  Approximately 5 minutes was spent documenting and reviewing patient's chart.   After careful review of your answers, I would not recommend and antibiotic for your condition.  Antibiotics should not be used to treat conditions that we suspect are caused by viruses like the virus that causes the common cold or flu. However, some people can have Strep with atypical symptoms. You may need formal testing in clinic or office to confirm if your symptoms continue or worsen.  Providers prescribe antibiotics to treat infections caused by bacteria. Antibiotics are very powerful in treating bacterial infections when they are used properly.  To maintain their effectiveness, they should be used only when necessary.  Overuse of antibiotics has resulted in the development of super bugs that are  resistant to treatment!    Home Care:  Only take medications as instructed by your medical team.  Do not drink alcohol while taking these medications.  A steam or ultrasonic humidifier can help congestion.  You can place a towel over your head and breathe in the steam from hot water coming from a faucet.  Avoid close contacts especially the very young and the elderly.  Cover your mouth when you cough or sneeze.  Always remember to wash your hands.  Get Help Right Away If:  You develop worsening fever or throat pain.  You develop a severe head ache or visual changes.  Your symptoms persist after you have completed your treatment plan.  Make sure you  Understand these instructions.  Will watch your condition.  Will get help right away if you are not doing well or get worse.  Your e-visit answers were reviewed by a board certified advanced clinical practitioner to complete your personal care plan.  Depending on the condition, your plan could have included both over the counter or prescription medications.  If there is a problem please reply  once you have received a response from your provider.  Your safety is important to [place].  If you have drug allergies check your prescription carefully.    You can use MyChart to ask questions about todays visit, request a non-urgent call back, or ask for a work or school excuse for 24 hours related to this e-Visit. If it has been greater than 24 hours you will need to  follow up with your provider, or enter a new e-Visit to address those concerns.  You will get an e-mail in the next two days asking about your experience.  I hope that your e-visit has been valuable and will speed your recovery. Thank you for using e-visits.

## 2019-04-24 ENCOUNTER — Encounter: Payer: Self-pay | Admitting: Family Medicine

## 2019-04-26 ENCOUNTER — Encounter: Payer: Self-pay | Admitting: Family Medicine

## 2019-05-01 ENCOUNTER — Telehealth (INDEPENDENT_AMBULATORY_CARE_PROVIDER_SITE_OTHER): Payer: 59 | Admitting: Family Medicine

## 2019-05-01 ENCOUNTER — Other Ambulatory Visit: Payer: Self-pay

## 2019-05-01 DIAGNOSIS — T148XXA Other injury of unspecified body region, initial encounter: Secondary | ICD-10-CM | POA: Diagnosis not present

## 2019-05-01 DIAGNOSIS — G4709 Other insomnia: Secondary | ICD-10-CM

## 2019-05-01 DIAGNOSIS — R5382 Chronic fatigue, unspecified: Secondary | ICD-10-CM

## 2019-05-01 DIAGNOSIS — R0982 Postnasal drip: Secondary | ICD-10-CM | POA: Diagnosis not present

## 2019-05-01 DIAGNOSIS — Z8616 Personal history of COVID-19: Secondary | ICD-10-CM

## 2019-05-01 DIAGNOSIS — Z8619 Personal history of other infectious and parasitic diseases: Secondary | ICD-10-CM

## 2019-05-01 DIAGNOSIS — M940 Chondrocostal junction syndrome [Tietze]: Secondary | ICD-10-CM

## 2019-05-01 MED ORDER — HYDROXYZINE PAMOATE 100 MG PO CAPS: 100.0000 mg | ORAL_CAPSULE | Freq: Every evening | ORAL | 1 refills | Status: DC | PRN

## 2019-05-01 MED ORDER — ALBUTEROL SULFATE HFA 108 (90 BASE) MCG/ACT IN AERS: 2.0000 | INHALATION_SPRAY | Freq: Four times a day (QID) | RESPIRATORY_TRACT | 3 refills | Status: DC | PRN

## 2019-05-01 NOTE — Progress Notes (Signed)
Virtual Visit via Video Note  I connected with Tim Delgado on 05/01/19 at  1:00 PM EDT by a video enabled telemedicine application 2/2 COVID-19 pandemic and verified that I am speaking with the correct person using two identifiers.  Location patient: home Location provider:work or home office Persons participating in the virtual visit: patient, provider  I discussed the limitations of evaluation and management by telemedicine and the availability of in person appointments. The patient expressed understanding and agreed to proceed.   HPI: Pt recovering from COVID-19 infection.   Dx'd 02/22/19 in ED.  Notes slow improvement.  Pt trying to do more around the house and walking outside.  Still feel fatigued at times, but notes his breathing is better.  Can feel "challenged in the breathing area" when resting.   Using albuterol inhaler prn, needs refill.  Has increased phlegm production in the morning.  Using allergy medicine intermittently.  Still having intermittent body aches, insomnia, and costochondritis.  Pt taking hydroxyzine 50 mg prn for sleep.  States the medicine only works if he is already tired.  Pt notes he may sleep 4 hours, then wake up.  When waking up it may take time to finally be able to get out of the bed.  Pt having to take short term disability as 12 wks of FMLA has ended.    ROS: See pertinent positives and negatives per HPI.  Past Medical History:  Diagnosis Date  . COVID-19     No past surgical history on file.  No family history on file.   Current Outpatient Medications:  .  hydrOXYzine (ATARAX/VISTARIL) 50 MG tablet, Take 1 tablet (50 mg total) by mouth at bedtime as needed., Disp: 30 tablet, Rfl: 1 .  ibuprofen (ADVIL) 600 MG tablet, Take 1 tablet (600 mg total) by mouth every 8 (eight) hours as needed., Disp: 30 tablet, Rfl: 0 .  lidocaine (XYLOCAINE) 2 % solution, Use as directed 15 mLs in the mouth or throat as needed for mouth pain., Disp: 100 mL, Rfl: 0 .   ondansetron (ZOFRAN ODT) 8 MG disintegrating tablet, Take 1 tablet (8 mg total) by mouth every 8 (eight) hours as needed for nausea or vomiting., Disp: 20 tablet, Rfl: 0  EXAM:  VITALS per patient if applicable:  GENERAL: alert, oriented, appears well and in no acute distress  HEENT: atraumatic, conjunctiva clear, no obvious abnormalities on inspection of external nose and ears  NECK: normal movements of the head and neck  LUNGS: on inspection no signs of respiratory distress, breathing rate appears normal, no obvious gross SOB, gasping or wheezing  CV: no obvious cyanosis  MS: moves all visible extremities without noticeable abnormality  PSYCH/NEURO: pleasant and cooperative, no obvious depression or anxiety, speech and thought processing grossly intact  ASSESSMENT AND PLAN:  Discussed the following assessment and plan:  H/o of COVID-19  -recovering.  Dx'd 02/22/19 -pt with continued fatigue, intermittent SOB -discussed working on endurance.  Referral placed for PT. -FMLA forms and short term disability forms previously completed.  Chronic fatigue -improving slowly -2/2 COVID 19 infection -pt encouraged to increase physical activity daily as tolerated.  -referral placed for PT  Muscle strain -discussed ways to reduce tension in neck -continue supportive care: heat, massage, stretching, tylenol -referral to PT  Post-nasal drainage -discussed using nasal spray and OTC allergy meds  Costochondritis -continue NSAIDs prn  Insomnia -likely 2/2 COVID 19 infection -will d/c hydroxyzine 50 mg. -will start hydroxyzine 100 mg qhs prn. -continue sleep hygiene  F/u prn    I discussed the assessment and treatment plan with the patient. The patient was provided an opportunity to ask questions and all were answered. The patient agreed with the plan and demonstrated an understanding of the instructions.   The patient was advised to call back or seek an in-person evaluation if  the symptoms worsen or if the condition fails to improve as anticipated.   Billie Ruddy, MD

## 2019-05-01 NOTE — Telephone Encounter (Signed)
Pt has a virtual visit scheduled this afternoon

## 2019-05-07 ENCOUNTER — Encounter: Payer: Self-pay | Admitting: Family Medicine

## 2019-05-08 ENCOUNTER — Encounter: Payer: Self-pay | Admitting: Family Medicine

## 2019-05-10 ENCOUNTER — Encounter: Payer: Self-pay | Admitting: Family Medicine

## 2019-05-10 NOTE — Telephone Encounter (Signed)
Pt FMLA forms were faxed and confirmation received

## 2019-05-15 ENCOUNTER — Encounter: Payer: Self-pay | Admitting: Family Medicine

## 2019-05-16 ENCOUNTER — Encounter: Payer: Self-pay | Admitting: Physical Therapy

## 2019-05-16 ENCOUNTER — Ambulatory Visit: Payer: 59 | Attending: Family Medicine | Admitting: Physical Therapy

## 2019-05-16 ENCOUNTER — Other Ambulatory Visit: Payer: Self-pay

## 2019-05-16 DIAGNOSIS — R262 Difficulty in walking, not elsewhere classified: Secondary | ICD-10-CM | POA: Insufficient documentation

## 2019-05-16 DIAGNOSIS — M6281 Muscle weakness (generalized): Secondary | ICD-10-CM

## 2019-05-16 NOTE — Therapy (Signed)
[facility] [address] [place], [place], [address] Phone: [phone]   Fax:  [phone]  Physical Therapy Evaluation  Patient Details  Name: Tim Delgado MRN: [id number] Date of Birth: [birth year] Referring Provider (PT): Dr. Abbe Amsterdam    Encounter Date: 05/16/2019  PT End of Session - 05/16/19 1631    Visit Number  1    Date for PT Re-Evaluation  07/11/19    PT Start Time  1530    PT Stop Time  1615    PT Time Calculation (min)  45 min    Activity Tolerance  Patient tolerated treatment well       Past Medical History:  Diagnosis Date  . COVID-19     History reviewed. No pertinent surgical history.  There were no vitals filed for this visit.   Subjective Assessment - 05/16/19 1532    Subjective  Covid in July.  Not hospitalized.  Digestive and respiratory.  Still having fatigue with activity.    Feeling better though.  Working in Building control surveyor.  Unable to work yet.  Costochondritis pain and shortness of breath.    Pertinent History  asthma    Limitations  Walking;House hold activities    How long can you stand comfortably?  20-30 minutes    How long can you walk comfortably?  [place] the rest of the day;  100 yards comfortably ?    Patient Stated Goals  stretch my muscles, endurance, stamina    Currently in Pain?  No/denies    Pain Score  0-No pain    Pain Location  Chest    Aggravating Factors   mornings, walking, standing;  up and down steps in home    Pain Relieving Factors  at rest         [facility] PT Assessment - 05/16/19 0001      Assessment   Medical Diagnosis  post coronavirus;chronic fatigue; muscle strain, costochondritis     Referring Provider (PT)  Dr. Abbe Amsterdam     Onset Date/Surgical Date  01/28/19    Next MD Visit  after PT    Prior Therapy  no      Precautions   Precautions  None      Restrictions   Weight Bearing Restrictions   No      Balance Screen   Has the patient fallen in the past 6 months  No    Has the patient had a decrease in activity level because of a fear of falling?   No    Is the patient reluctant to leave their home because of a fear of falling?   No      Home Environment   Living Environment  Private residence    Living Arrangements  Alone    Type of Home  House    Home Layout  Two level      Prior Function   Level of Independence  Independent with basic ADLs    Vocation  Full time employment   Herbie Drape does some lifting    Vocation Requirements  out of work until November;  prolonged standing     Leisure  beach, cooking       Observation/Other Assessments   Focus on Therapeutic Outcomes (FOTO)   Post Covid 19 Functional Status Scale (PCFS Scale) Moderate Functional Limitation    Other Surveys   Functional Activites Questionairre (FAQ)  AROM   Overall AROM Comments  UE and LE ROM WFLS      Strength   Overall Strength Comments  able to rise from standard chair without UE assist;  UE strength 4+/5       6 Minute Walk- Baseline   6 Minute Walk- Baseline  yes    HR (bpm)  122    02 Sat (%RA)  99 %      6 minute walk test results    Aerobic Endurance Distance Walked  1350    Endurance additional comments  perceived exertion 10 out of 20      Standardized Balance Assessment   Five times sit to stand comments   13.04      High Level Balance   High Level Balance Comments  1 minute step test 99% 123 HR                 Objective measurements completed on examination: See above findings.              PT Education - 05/16/19 1619    Education Details  Your Access [email or web address]  walking program 7 minutes 2x/day;  30 second intervals:  sit to stand, step ups, counter push ups    Person(s) Educated  Patient    Methods  Explanation;Demonstration;Handout    Comprehension  Returned demonstration;Verbalized understanding       PT Short Term Goals - 05/16/19  1710      PT SHORT TERM GOAL #1   Title  The patient will be able to walk 12-15 minutes  with perceived exertion <10 out of 20    Time  4    Period  Weeks    Status  New    Target Date  06/13/19      PT SHORT TERM GOAL #2   Title  The patient will be able to participate in 30 minutes of low to moderate intensity exercise with O2 saturation level at 95% or higher    Time  4    Period  Weeks    Status  New      PT SHORT TERM GOAL #3   Title  5x sit to stand 12 or faster indicating improved strength and stamina    Time  4    Period  Weeks    Status  New        PT Long Term Goals - 05/16/19 1712      PT LONG TERM GOAL #1   Title  The patient will be independent in safe self progression of HEP    Time  8    Period  Weeks    Status  New    Target Date  07/11/19      PT LONG TERM GOAL #2   Title  The patient will be able to walk 30 minutes with perceived exertional level 10 or below    Time  8    Period  Weeks    Status  New      PT LONG TERM GOAL #3   Title  The patient will have core, UE and LE strength to grossly 5-/5 needed to lift 15# for work duties    Time  8    Period  Weeks    Status  New      PT LONG TERM GOAL #4   Title  Six minute walk test to 1550 feet indicating improved gait endurance    Time  8  Period  Weeks    Status  New      PT LONG TERM GOAL #5   Title  Post Covid 19 Functional Status Scale improved to Grade 2 Slight Functional limitation level    Time  8    Period  Weeks    Status  New             Plan - 05/16/19 1633    Clinical Impression Statement  The patient is a previously healthy 28 year old who had the coronavirus in early July.  He reports he had every symptom associated with the disease and although he was not hospitalized he went to the ED several times.  He reports continued shortness of breath,  generalized weakness and lack of stamina that have persisted since then.  He has been unable to return to work which includes  standing all day and occasional lifting.  He walked 1.6 miles at the park over the weekend and was so exhausted he had to go to bed immediately when he got home.  Decreased 5x sit to stand test 13.04 sec.  Six minute walk time 1350 feet well below expected norms for age of at least 2000 feet.  Step test and walking test cause HR to increase from 80 to 123 bpm but O2 saturation remains 98% or above. He has audible shortness of breath requiring rest breaks between each test.  General core, UE and LE grossly 4 to 4+/5.  Post Covid 19 Functional Status Scale (PCFS) grade 3 Moderate Functional Limitation.  He would benefit from PT to address these deficits and facilitate return to work and home functional activities.    Examination-Activity Limitations  Lift;Stand;Stairs;Locomotion Level    Examination-Participation Restrictions  Community Activity    Stability/Clinical Decision Making  Stable/Uncomplicated    Clinical Decision Making  Low    Rehab Potential  Good    PT Frequency  2x / week    PT Duration  8 weeks    PT Treatment/Interventions  ADLs/Self Care Home Management;Therapeutic activities;Therapeutic exercise;Neuromuscular re-education;Manual techniques;Patient/family education    PT Next Visit Plan  monitor O2 sats;  Nu-Step, bike or UBE;  functional interval strengthening step ups, counter push ups, sit to stand add weight;  leg press;  hip hinge/dead lifting       Patient will benefit from skilled therapeutic intervention in order to improve the following deficits and impairments:  Difficulty walking, Cardiopulmonary status limiting activity, Decreased endurance, Decreased activity tolerance, Impaired perceived functional ability, Decreased strength  Visit Diagnosis: Muscle weakness (generalized) - Plan: PT plan of care cert/re-cert  Difficulty in walking, not elsewhere classified - Plan: PT plan of care cert/re-cert     Problem List Patient Active Problem List   Diagnosis Date Noted   . History of asthma 03/03/2019   Lavinia SharpsStacy Phuoc Huy, PT 05/16/19 5:25 PM Phone: [phone] Fax: [phone] Vivien PrestoSimpson, Kingslee Mairena C 05/16/2019, 5:24 PM  [facility] [address] [place], [place], [address] Phone: [phone]   Fax:  [phone]  Name: Tim Delgado MRN: [id number] Date of Birth: 06/18/1991

## 2019-05-16 NOTE — Patient Instructions (Signed)
Access Code: [email or web address]  URL: [email or web address]  Date: 05/16/2019  Prepared by: Ruben Im   Exercises  Sit to Stand - 10 reps - 2 sets - 1x daily - 7x weekly  Step Up - 10 reps - 2 sets - 1x daily - 7x weekly  Push Up on Table - 10 reps - 2 sets - 1x daily - 7x weekly

## 2019-06-06 ENCOUNTER — Telehealth (INDEPENDENT_AMBULATORY_CARE_PROVIDER_SITE_OTHER): Payer: 59 | Admitting: Family Medicine

## 2019-06-06 DIAGNOSIS — Z8709 Personal history of other diseases of the respiratory system: Secondary | ICD-10-CM

## 2019-06-06 DIAGNOSIS — R5382 Chronic fatigue, unspecified: Secondary | ICD-10-CM

## 2019-06-06 DIAGNOSIS — Z8616 Personal history of COVID-19: Secondary | ICD-10-CM

## 2019-06-06 DIAGNOSIS — G4709 Other insomnia: Secondary | ICD-10-CM | POA: Diagnosis not present

## 2019-06-06 DIAGNOSIS — M940 Chondrocostal junction syndrome [Tietze]: Secondary | ICD-10-CM | POA: Diagnosis not present

## 2019-06-06 DIAGNOSIS — R0602 Shortness of breath: Secondary | ICD-10-CM

## 2019-06-06 DIAGNOSIS — Z8619 Personal history of other infectious and parasitic diseases: Secondary | ICD-10-CM | POA: Diagnosis not present

## 2019-06-06 MED ORDER — HYDROXYZINE PAMOATE 100 MG PO CAPS: 100.0000 mg | ORAL_CAPSULE | Freq: Every evening | ORAL | 2 refills | Status: DC | PRN

## 2019-06-06 NOTE — Progress Notes (Signed)
Virtual Visit via Video Note  I connected with@ on 06/06/19 at  3:00 PM EST by a video enabled telemedicine application 2/2 COVID-19 pandemic and verified that I am speaking with the correct person using two identifiers.  Location patient: home Location provider:work or home office Persons participating in the virtual visit: patient, provider  I discussed the limitations of evaluation and management by telemedicine and the availability of in person appointments. The patient expressed understanding and agreed to proceed.   HPI: Pt is a 28 yo male with pmh sig for mild intermittent asthma and h/o COVID-19 infection 02/06/19 with prolonged recovery.    Pt feels like he is slowly getting better.  In PT, has session tomorrow.  Trying to do more walking around his apartment complex.  States breathing and sharp pain in chest are a challenge when he is more active or at night if takes deep breaths in.  May take tylenol or ibuprofen for costochondritis pain.  States fatigue is improving.  Pt wants to get back to work, but wants to make sure from a health perspective he will be able.  Night sweats have resolved, HAs are improving.  Pt using Mucinex and saline nasal rinse for increased phlegm.  Pt notes dealing with anxiety surrounding his illness.   ROS: See pertinent positives and negatives per HPI.  Social hx: pt is a Production designer, theatre/television/film at [employer].  Pt's has worked for [employer] x 3 yrs and they have been supportive.  Pt recently was able to visit family 3 hours away, but notes he felt exhausted after the drive.  Pt states in the past he could visit and drive back the same day, but he was not able to do that this time.  Past Medical History:  Diagnosis Date  . COVID-19     No past surgical history on file.  No family history on file.    Current Outpatient Medications:  .  albuterol (VENTOLIN HFA) 108 (90 Base) MCG/ACT inhaler, Inhale 2 puffs into the lungs every 6 (six) hours as  needed for wheezing or shortness of breath., Disp: 6.7 g, Rfl: 3 .  hydrOXYzine (VISTARIL) 100 MG capsule, Take 1 capsule (100 mg total) by mouth at bedtime as needed for itching., Disp: 30 capsule, Rfl: 1 .  ibuprofen (ADVIL) 600 MG tablet, Take 1 tablet (600 mg total) by mouth every 8 (eight) hours as needed., Disp: 30 tablet, Rfl: 0 .  lidocaine (XYLOCAINE) 2 % solution, Use as directed 15 mLs in the mouth or throat as needed for mouth pain., Disp: 100 mL, Rfl: 0 .  ondansetron (ZOFRAN ODT) 8 MG disintegrating tablet, Take 1 tablet (8 mg total) by mouth every 8 (eight) hours as needed for nausea or vomiting., Disp: 20 tablet, Rfl: 0  EXAM:  VITALS per patient if applicable:  RR between 12-20 bpm  GENERAL: alert, oriented, appears well and in no acute distress  HEENT: atraumatic, conjunctiva clear, no obvious abnormalities on inspection of external nose and ears  NECK: normal movements of the head and neck  LUNGS: on inspection no signs of respiratory distress, breathing rate appears normal, no obvious gross SOB, gasping or wheezing  CV: no obvious cyanosis  MS: moves all visible extremities without noticeable abnormality  PSYCH/NEURO: pleasant and cooperative, no obvious depression or anxiety, speech and thought processing grossly intact  ASSESSMENT AND PLAN:  Discussed the following assessment and plan:  History of 2019 novel coronavirus disease (COVID-19)  -recovering - Plan:  Ambulatory referral to Pulmonology  Chronic fatigue -2/2 COVID 19 infection in July 2020. -slowly improving -continue PT and walking daily  Costochondritis -continue supportive care -Tylenol prn, stretching, heat  Other insomnia - Plan: hydrOXYzine (VISTARIL) 100 MG capsule  SOB (shortness of breath)  -2/2 COVID 19 infection -improving, however still having intermittent sharp pains.  Discussed referral to Pulmonology  For PFTs and imaging. - Plan: Ambulatory referral to Pulmonology  History  of asthma  -controlled.   -albuterol inhaler prn -consider PFTs.  Will place referral to Pulm. - Plan: Ambulatory referral to Pulmonology  F/u in the next 2-4 wks   I discussed the assessment and treatment plan with the patient. The patient was provided an opportunity to ask questions and all were answered. The patient agreed with the plan and demonstrated an understanding of the instructions.   The patient was advised to call back or seek an in-person evaluation if the symptoms worsen or if the condition fails to improve as anticipated.   Billie Ruddy, MD

## 2019-06-07 ENCOUNTER — Encounter: Payer: Self-pay | Admitting: Physical Therapy

## 2019-06-07 ENCOUNTER — Ambulatory Visit: Payer: 59 | Attending: Family Medicine | Admitting: Physical Therapy

## 2019-06-07 ENCOUNTER — Other Ambulatory Visit: Payer: Self-pay

## 2019-06-07 DIAGNOSIS — R262 Difficulty in walking, not elsewhere classified: Secondary | ICD-10-CM | POA: Insufficient documentation

## 2019-06-07 DIAGNOSIS — M6281 Muscle weakness (generalized): Secondary | ICD-10-CM | POA: Insufficient documentation

## 2019-06-07 NOTE — Patient Instructions (Signed)
Access Code: [email or web address]  URL: [email or web address]  Date: 06/07/2019  Prepared by: Jari Favre   Exercises  Sit to Stand - 10 reps - 2 sets - 1x daily - 7x weekly  Step Up - 10 reps - 2 sets - 1x daily - 7x weekly  Push Up on Table - 10 reps - 2 sets - 1x daily - 7x weekly  Open Book Chest Stretch on Towel Roll - 10 reps - 3 sets - 1x daily - 7x weekly  Supine Diaphragmatic Breathing - 10 reps - 1 sets - 3x daily - 7x weekly

## 2019-06-07 NOTE — Therapy (Signed)
[facility] [address], [address] [place], [place], [address] Phone: [phone]   Fax:  [phone]  Physical Therapy Treatment  Patient Details  Name: Tim Delgado MRN: [id number] Date of Birth: 1990-12-26 Referring Provider (PT): Dr. Abbe Amsterdam    Encounter Date: 06/07/2019  PT End of Session - 06/07/19 1445    Visit Number  2    Date for PT Re-Evaluation  07/11/19    PT Start Time  1401    PT Stop Time  1450    PT Time Calculation (min)  49 min    Activity Tolerance  Patient tolerated treatment well    Behavior During Therapy  [facility] for tasks assessed/performed       Past Medical History:  Diagnosis Date  . COVID-19     History reviewed. No pertinent surgical history.  There were no vitals filed for this visit.  Subjective Assessment - 06/07/19 1409    Subjective  I am doing okay. No changes since eval.    Patient Stated Goals  stretch my muscles, endurance, stamina    Currently in Pain?  Yes    Pain Score  4    when on UBE   Pain Location  Chest    Pain Radiating Towards  to the back    Aggravating Factors   when getting out of breath    Pain Relieving Factors  rest    Multiple Pain Sites  No                       [facility] Adult PT Treatment/Exercise - 06/07/19 0001      Neuro Re-ed    Neuro Re-ed Details   diaphragmatic breathing      Exercises   Exercises  Knee/Hip      Knee/Hip Exercises: Aerobic   Nustep  L3 intervals of 20 sec fast; 40 sec slow x 10 min   PT present to monitor   Other Aerobic  UBE - L1 3x3 fwd/back - cues for posture and PT present for status and monitor      Shoulder Exercises: Supine   Other Supine Exercises  goal post stretch - 2 min      Manual Therapy   Manual Therapy  Myofascial release;Soft tissue mobilization    Soft tissue mobilization  supine to upper rectus ab, prone to thoracic paraspinals    Myofascial Release  supine release to diaphragm              PT Education - 06/07/19 1444    Education Details  Access Code: [email or web address]    Person(s) Educated  Patient    Methods  Demonstration;Explanation;Verbal cues;Handout;Tactile cues    Comprehension  Verbalized understanding;Returned demonstration       PT Short Term Goals - 05/16/19 1710      PT SHORT TERM GOAL #1   Title  The patient will be able to walk 12-15 minutes  with perceived exertion <10 out of 20    Time  4    Period  Weeks    Status  New    Target Date  06/13/19      PT SHORT TERM GOAL #2   Title  The patient will be able to participate in 30 minutes of low to moderate intensity exercise with O2 saturation level at 95% or higher    Time  4    Period  Weeks    Status  New  PT SHORT TERM GOAL #3   Title  5x sit to stand 12 or faster indicating improved strength and stamina    Time  4    Period  Weeks    Status  New        PT Long Term Goals - 05/16/19 1712      PT LONG TERM GOAL #1   Title  The patient will be independent in safe self progression of HEP    Time  8    Period  Weeks    Status  New    Target Date  07/11/19      PT LONG TERM GOAL #2   Title  The patient will be able to walk 30 minutes with perceived exertional level 10 or below    Time  8    Period  Weeks    Status  New      PT LONG TERM GOAL #3   Title  The patient will have core, UE and LE strength to grossly 5-/5 needed to lift 15# for work duties    Time  8    Period  Weeks    Status  New      PT LONG TERM GOAL #4   Title  Six minute walk test to 1550 feet indicating improved gait endurance    Time  8    Period  Weeks    Status  New      PT LONG TERM GOAL #5   Title  Post Covid 19 Functional Status Scale improved to Grade 2 Slight Functional limitation level    Time  8    Period  Weeks    Status  New            Plan - 06/07/19 1453    Clinical Impression Statement  Pt tolerated exercises well today.  He reported less pain on nustep which was performed  after manual therapy to diaphragm and pec stretches.  Pt demonstrates thoracic kyphosis and increased shortening of pec muscles.  He has pain when taking a deep breath.  He was educated in how to correctly perform diaphragmatic breahting in order to imporve soft tissue length and restore better breathing function.  pt will benefit from skilled PT to work on strength, endurance and posture.    PT Treatment/Interventions  ADLs/Self Care Home Management;Therapeutic activities;Therapeutic exercise;Neuromuscular re-education;Manual techniques;Patient/family education    PT Next Visit Plan  f/u on diaphragmatic breathing, monitor O2 sats;  Nu-Step, bike or UBE;  functional interval strengthening step ups, counter push ups, sit to stand add weight;  leg press;  hip hinge/dead lifting    PT Home Exercise Plan  Access Code: [email or web address]    Consulted and Agree with Plan of Care  Patient       Patient will benefit from skilled therapeutic intervention in order to improve the following deficits and impairments:  Difficulty walking, Cardiopulmonary status limiting activity, Decreased endurance, Decreased activity tolerance, Impaired perceived functional ability, Decreased strength  Visit Diagnosis: Muscle weakness (generalized)  Difficulty in walking, not elsewhere classified     Problem List Patient Active Problem List   Diagnosis Date Noted  . History of 2019 novel coronavirus disease (COVID-19) 06/06/2019  . History of asthma 03/03/2019    Jule Ser, PT 06/07/2019, 3:02 PM  [facility] [address], [place], [place], [address] Phone: [phone]   Fax:  [phone]  Name: Ahren Pettinger MRN: [id number] Date of Birth: [birth year]

## 2019-06-14 ENCOUNTER — Ambulatory Visit: Payer: 59 | Admitting: Physical Therapy

## 2019-06-14 ENCOUNTER — Encounter: Payer: Self-pay | Admitting: Physical Therapy

## 2019-06-14 ENCOUNTER — Other Ambulatory Visit: Payer: Self-pay

## 2019-06-14 DIAGNOSIS — M6281 Muscle weakness (generalized): Secondary | ICD-10-CM

## 2019-06-14 DIAGNOSIS — R262 Difficulty in walking, not elsewhere classified: Secondary | ICD-10-CM

## 2019-06-14 NOTE — Therapy (Signed)
[facility] [address], [place], [place], [address] Phone: [phone]   Fax:  [phone]  Physical Therapy Treatment  Patient Details  Name: Jermanie Minshall MRN: [id number] Date of Birth: 24-Mar-1991 Referring Provider (PT): Dr. Grier Mitts    Encounter Date: 06/14/2019  PT End of Session - 06/14/19 1513    Visit Number  3    Date for PT Re-Evaluation  07/11/19    PT Start Time  1027    PT Stop Time  1528    PT Time Calculation (min)  41 min    Activity Tolerance  Patient tolerated treatment well    Behavior During Therapy  [facility] for tasks assessed/performed       Past Medical History:  Diagnosis Date  . COVID-19     History reviewed. No pertinent surgical history.  There were no vitals filed for this visit.  Subjective Assessment - 06/14/19 1450    Subjective  No pain currently.  It feels a little looser overall.  I had soreness a couple days after the last session.  It feels like a combination of muscle tension and pain with breathing.    Patient Stated Goals  stretch my muscles, endurance, stamina    Currently in Pain?  No/denies         [facility] PT Assessment - 06/14/19 0001      6 minute walk test results    Aerobic Endurance Distance Walked  1300    Endurance additional comments  5/20      Standardized Balance Assessment   Five times sit to stand comments   10 sec                   [facility] Adult PT Treatment/Exercise - 06/14/19 0001      Lumbar Exercises: Standing   Other Standing Lumbar Exercises  pallof punch with red band    Other Standing Lumbar Exercises  standing doorway stretch - 3 x 20 sec each side      Knee/Hip Exercises: Aerobic   Other Aerobic  UBE - L1 3x3 fwd/back - cues for posture and PT present for status and monitor      Knee/Hip Exercises: Standing   Forward Step Up  Right;Left;2 sets;10 reps;Step Height: 6"    Other Standing Knee Exercises  dead lift form - no  weight lifting               PT Short Term Goals - 06/14/19 1500      PT SHORT TERM GOAL #1   Title  The patient will be able to walk 12-15 minutes  with perceived exertion <10 out of 20    Baseline  10 minutes and feels like it is 10    Status  On-going      PT SHORT TERM GOAL #3   Title  5x sit to stand 12 or faster indicating improved strength and stamina    Baseline  10 sec    Status  Achieved        PT Long Term Goals - 06/14/19 1516      PT LONG TERM GOAL #1   Title  The patient will be independent in safe self progression of HEP    Status  On-going      PT LONG TERM GOAL #2   Title  The patient will be able to walk 30 minutes with perceived exertional level 10 or below    Baseline  walks  10 minuste at a time, RPE 10    Status  On-going      PT LONG TERM GOAL #4   Title  Six minute walk test to 1550 feet indicating improved gait endurance    Baseline  1300, RPE 5            Plan - 06/14/19 1702    Clinical Impression Statement  Pt achieved sit to stand goal.  He also did 6 min walk test with less exertion, but distance was about the same.  Patient was able to work on dead lift technique today.  He continues to need skilled PT to continue with POC and he isexpected to make progress towards remaining functional goals.    PT Treatment/Interventions  ADLs/Self Care Home Management;Therapeutic activities;Therapeutic exercise;Neuromuscular re-education;Manual techniques;Patient/family education    PT Next Visit Plan  f/u on diaphragmatic breathing, monitor O2 sats;  Nu-Step, bike or UBE;  functional interval strengthening step ups, counter push ups, sit to stand add weight;  leg press;  hip hinge/dead lifting    PT Home Exercise Plan  Access Code: [email or web address]    Consulted and Agree with Plan of Care  Patient       Patient will benefit from skilled therapeutic intervention in order to improve the following deficits and impairments:  Difficulty walking,  Cardiopulmonary status limiting activity, Decreased endurance, Decreased activity tolerance, Impaired perceived functional ability, Decreased strength  Visit Diagnosis: Muscle weakness (generalized)  Difficulty in walking, not elsewhere classified     Problem List Patient Active Problem List   Diagnosis Date Noted  . History of 2019 novel coronavirus disease (COVID-19) 06/06/2019  . History of asthma 03/03/2019    Junious Silk, PT 06/14/2019, 5:09 PM  [facility] [address], [address] [place], [place], [address] Phone: [phone]   Fax:  [phone]  Name: Fuquan Wilson MRN: [id number] Date of Birth: Feb 15, 1991

## 2019-06-18 ENCOUNTER — Encounter: Payer: Self-pay | Admitting: Family Medicine

## 2019-06-27 ENCOUNTER — Ambulatory Visit: Payer: 59 | Admitting: Physical Therapy

## 2019-06-29 ENCOUNTER — Other Ambulatory Visit: Payer: Self-pay

## 2019-06-29 ENCOUNTER — Ambulatory Visit: Payer: 59 | Attending: Family Medicine | Admitting: Physical Therapy

## 2019-06-29 DIAGNOSIS — R262 Difficulty in walking, not elsewhere classified: Secondary | ICD-10-CM | POA: Diagnosis present

## 2019-06-29 DIAGNOSIS — M6281 Muscle weakness (generalized): Secondary | ICD-10-CM | POA: Insufficient documentation

## 2019-06-29 NOTE — Therapy (Signed)
[facility] [address] [place], [place], [address] Phone: [phone]   Fax:  [phone]  Physical Therapy Treatment  Patient Details  Name: Tim Delgado MRN: [id number] Date of Birth: 1991/01/14 Referring Provider (PT): Dr. Grier Mitts    Encounter Date: 06/29/2019  PT End of Session - 06/29/19 1543    Visit Number  4    Date for PT Re-Evaluation  07/11/19    PT Start Time  1025    PT Stop Time  1613    PT Time Calculation (min)  40 min    Activity Tolerance  Patient tolerated treatment well    Behavior During Therapy  [facility] for tasks assessed/performed       Past Medical History:  Diagnosis Date  . COVID-19     No past surgical history on file.  There were no vitals filed for this visit.  Subjective Assessment - 06/29/19 1538    Subjective  Pt states he is slowly feeling better.    Patient Stated Goals  stretch my muscles, endurance, stamina    Currently in Pain?  No/denies                       [facility] Adult PT Treatment/Exercise - 06/29/19 0001      Lumbar Exercises: Standing   Other Standing Lumbar Exercises  standing with foam roll against the wall - red band - UE diagonals and horizontal abduction - 20x each way      Knee/Hip Exercises: Aerobic   Elliptical  L1 x 5 min - PT present for status update    Nustep  L3 1 min slow; 1 min fast x 6 min      Knee/Hip Exercises: Standing   Forward Step Up  Right;Left;2 sets;10 reps;Step Height: 6"      Manual Therapy   Manual Therapy  Soft tissue mobilization    Soft tissue mobilization  prone thoracic paraspinals, AP mobs T4-8               PT Short Term Goals - 06/29/19 1540      PT SHORT TERM GOAL #1   Title  The patient will be able to walk 12-15 minutes  with perceived exertion <10 out of 20    Baseline  hasn't been walking because of the weather      PT SHORT TERM GOAL #2   Title  The patient will be able to  participate in 30 minutes of low to moderate intensity exercise with O2 saturation level at 95% or higher    Baseline  able to exercise today without decreased O2    Status  Achieved        PT Long Term Goals - 06/14/19 1516      PT LONG TERM GOAL #1   Title  The patient will be independent in safe self progression of HEP    Status  On-going      PT LONG TERM GOAL #2   Title  The patient will be able to walk 30 minutes with perceived exertional level 10 or below    Baseline  walks 10 minuste at a time, RPE 10    Status  On-going      PT LONG TERM GOAL #4   Title  Six minute walk test to 1550 feet indicating improved gait endurance    Baseline  1300, RPE 5  Plan - 06/29/19 1614    Clinical Impression Statement  Pt did well with cardio exercises and stayed at 99% O2.  Pt has never had any difficulty with his O2 levels at this point.  He states he felt fine thoughout today's session execpt for a little chest pain when doing horizontal abduction with the band, but he was still alble to complete.  Pt will benefit from postural strengthening and progression of endurance and cardio so he can return to work.    PT Treatment/Interventions  ADLs/Self Care Home Management;Therapeutic activities;Therapeutic exercise;Neuromuscular re-education;Manual techniques;Patient/family education    PT Next Visit Plan  diaphragmatic breathing, posture strength, endurance and intervals on equipment, functional strengthening    PT Home Exercise Plan  Access Code: [email or web address]    Consulted and Agree with Plan of Care  Patient       Patient will benefit from skilled therapeutic intervention in order to improve the following deficits and impairments:  Difficulty walking, Cardiopulmonary status limiting activity, Decreased endurance, Decreased activity tolerance, Impaired perceived functional ability, Decreased strength  Visit Diagnosis: Muscle weakness (generalized)  Difficulty in walking, not  elsewhere classified     Problem List Patient Active Problem List   Diagnosis Date Noted  . History of 2019 novel coronavirus disease (COVID-19) 06/06/2019  . History of asthma 03/03/2019    Junious Silk, PT 06/29/2019, 4:18 PM  [facility] [address], [address] [place], [place], [address] Phone: [phone]   Fax:  [phone]  Name: Tim Delgado MRN: [id number] Date of Birth: 1991/04/08

## 2019-06-30 ENCOUNTER — Ambulatory Visit: Payer: 59 | Admitting: Physical Therapy

## 2019-06-30 ENCOUNTER — Encounter: Payer: Self-pay | Admitting: Physical Therapy

## 2019-06-30 DIAGNOSIS — R262 Difficulty in walking, not elsewhere classified: Secondary | ICD-10-CM

## 2019-06-30 DIAGNOSIS — M6281 Muscle weakness (generalized): Secondary | ICD-10-CM

## 2019-06-30 NOTE — Therapy (Signed)
[facility] [address], [place], [place], [address] Phone: [phone]   Fax:  [phone]  Physical Therapy Treatment  Patient Details  Name: Tim Delgado MRN: [id number] Date of Birth: August 17, 1990 Referring Provider (PT): Dr. Grier Mitts    Encounter Date: 06/30/2019  PT End of Session - 06/30/19 1010    Visit Number  5    Date for PT Re-Evaluation  07/11/19    PT Start Time  0930    PT Stop Time  1013    PT Time Calculation (min)  43 min    Activity Tolerance  Patient tolerated treatment well    Behavior During Therapy  [facility] for tasks assessed/performed       Past Medical History:  Diagnosis Date  . COVID-19     History reviewed. No pertinent surgical history.  There were no vitals filed for this visit.  Subjective Assessment - 06/30/19 0933    Subjective  Did well after yesterday's visit.    Pertinent History  asthma    Limitations  Walking;House hold activities    How long can you stand comfortably?  20-30 minutes    How long can you walk comfortably?  10-15 min walks, improving endurance, not as tired as used to be after walks    Patient Stated Goals  stretch my muscles, endurance, stamina    Currently in Pain?  No/denies                       [facility] Adult PT Treatment/Exercise - 06/30/19 0001      Exercises   Exercises  Lumbar;Knee/Hip;Shoulder      Lumbar Exercises: Stretches   Other Lumbar Stretch Exercise  open books x 5 each direction with PT TC to breath into lateral aspect of ribcage    Other Lumbar Stretch Exercise  quadruped over green ball with TC to breathe into low back x 10 breath cycles      Lumbar Exercises: Aerobic   Elliptical  L3 incline 6, 3' warm up, 30 sec intervals fast/recovery pace x 1' each fwd, bwd, PT present to monitor      Lumbar Exercises: Sidelying   Other Sidelying Lumbar Exercises  over large blue physioball overhead reach for lateral trunk  stretch with breathing cue, Pt reported pain laying on Rt side so d/c'd that sie      Knee/Hip Exercises: Standing   Wall Squat  10 reps    Wall Squat Limitations  green ball on wall, holding 6# dumbbells bil UEs, PT cued inhale hold, exhale move, TC for lateral ribcage expansion      Shoulder Exercises: Standing   Other Standing Exercises  in wall squat against green ball 6# dumbbell overhead press bil 2x10 with rapid exhale with each press      Shoulder Exercises: ROM/Strengthening   UBE (Upper Arm Bike)  L4 x3' each way, 10 sec out of every 30 go fast, PT present to cue and monitor intervals and response      Manual Therapy   Manual Therapy  Soft tissue mobilization    Soft tissue mobilization  diaphragm in supine with breath cueing               PT Short Term Goals - 06/29/19 1540      PT SHORT TERM GOAL #1   Title  The patient will be able to walk 12-15 minutes  with perceived exertion <10 out of 20  Baseline  hasn't been walking because of the weather      PT SHORT TERM GOAL #2   Title  The patient will be able to participate in 30 minutes of low to moderate intensity exercise with O2 saturation level at 95% or higher    Baseline  able to exercise today without decreased O2    Status  Achieved        PT Long Term Goals - 06/14/19 1516      PT LONG TERM GOAL #1   Title  The patient will be independent in safe self progression of HEP    Status  On-going      PT LONG TERM GOAL #2   Title  The patient will be able to walk 30 minutes with perceived exertional level 10 or below    Baseline  walks 10 minuste at a time, RPE 10    Status  On-going      PT LONG TERM GOAL #4   Title  Six minute walk test to 1550 feet indicating improved gait endurance    Baseline  1300, RPE 5            Plan - 06/30/19 1010    Clinical Impression Statement  Pt has consistently managed O2 sat at 99% with all past appointments so PT went based on symptoms today.  Pt managed  breathing well with all interval cardio work today doing short bursts of fast pace both on elliptical and UBE.  PT also focused on TC and VC for breath pattern with squats, overhead press and trunk ROM.  Pt was able to display more spherical inhale including lateral and posterior air movement end of session.  PT also performed manual stretching of diaphragm in supine noting more tightness on Lt >Rt.  He will continue to benefit from skilled PT along current POC with careful progression and monitoring.       Patient will benefit from skilled therapeutic intervention in order to improve the following deficits and impairments:     Visit Diagnosis: Muscle weakness (generalized)  Difficulty in walking, not elsewhere classified     Problem List Patient Active Problem List   Diagnosis Date Noted  . History of 2019 novel coronavirus disease (COVID-19) 06/06/2019  . History of asthma 03/03/2019    Morton Peters, PT 06/30/19 10:15 AM   [facility] [address], [address] [place], [place], [address] Phone: [phone]   Fax:  [phone]  Name: Tim Delgado MRN: [id number] Date of Birth: 1990/10/20

## 2019-07-04 ENCOUNTER — Ambulatory Visit: Payer: 59 | Admitting: Physical Therapy

## 2019-07-04 ENCOUNTER — Encounter: Payer: Self-pay | Admitting: Physical Therapy

## 2019-07-04 ENCOUNTER — Other Ambulatory Visit: Payer: Self-pay

## 2019-07-04 DIAGNOSIS — M6281 Muscle weakness (generalized): Secondary | ICD-10-CM | POA: Diagnosis not present

## 2019-07-04 DIAGNOSIS — R262 Difficulty in walking, not elsewhere classified: Secondary | ICD-10-CM

## 2019-07-04 NOTE — Therapy (Signed)
[facility] [address] [place], [place], [address] Phone: [phone]   Fax:  [phone]  Physical Therapy Treatment  Patient Details  Name: Tim Delgado MRN: [id number] Date of Birth: [birth year] Referring Provider (PT): Dr. Abbe Amsterdam    Encounter Date: 07/04/2019  PT End of Session - 07/04/19 1529    Visit Number  6    Date for PT Re-Evaluation  07/11/19    PT Start Time  1529    PT Stop Time  1613    PT Time Calculation (min)  44 min    Activity Tolerance  Patient tolerated treatment well    Behavior During Therapy  [facility] for tasks assessed/performed       Past Medical History:  Diagnosis Date  . COVID-19     History reviewed. No pertinent surgical history.  There were no vitals filed for this visit.                    [facility] Adult PT Treatment/Exercise - 07/04/19 0001      Lumbar Exercises: Stretches   ITB Stretch  Right;Left;2 reps;20 seconds   with sidebend for side opening   Gastroc Stretch  Right;Left;2 reps;20 seconds    Other Lumbar Stretch Exercise  standing thoracic and lumbar ext at the wall - 10x 5 sec      Lumbar Exercises: Aerobic   Elliptical  L3 incline 6, 3' warm up, 30 sec intervals fast/recovery pace x 1' each fwd, bwd, PT present to monitor - 2 rounds of intervals then 1 min cool down      Lumbar Exercises: Standing   Other Standing Lumbar Exercises  pallof squats with antirotation - green - 10x each way    Other Standing Lumbar Exercises  trunk rotation resisted - green - 10x each side      Lumbar Exercises: Seated   Other Seated Lumbar Exercises  shoulder flexion with  3lb sitting on green ball - core engaged - 20x      Lumbar Exercises: Quadruped   Single Arm Raise  Right;Left;10 reps    Opposite Arm/Leg Raise  Right arm/Left leg;Left arm/Right leg;10 reps    Other Quadruped Lumbar Exercises  child pose with rotation - 3x30 sec hold      Knee/Hip  Exercises: Aerobic   Recumbent Bike  L5 1 min warm up; 30 sec intervals - 8 min total               PT Short Term Goals - 06/29/19 1540      PT SHORT TERM GOAL #1   Title  The patient will be able to walk 12-15 minutes  with perceived exertion <10 out of 20    Baseline  hasn't been walking because of the weather      PT SHORT TERM GOAL #2   Title  The patient will be able to participate in 30 minutes of low to moderate intensity exercise with O2 saturation level at 95% or higher    Baseline  able to exercise today without decreased O2    Status  Achieved        PT Long Term Goals - 06/14/19 1516      PT LONG TERM GOAL #1   Title  The patient will be independent in safe self progression of HEP    Status  On-going      PT LONG TERM GOAL #2   Title  The patient will be  able to walk 30 minutes with perceived exertional level 10 or below    Baseline  walks 10 minuste at a time, RPE 10    Status  On-going      PT LONG TERM GOAL #4   Title  Six minute walk test to 1550 feet indicating improved gait endurance    Baseline  1300, RPE 5            Plan - 07/04/19 1613    Clinical Impression Statement  Today's session focused on incorporating intervals with strengthening.  Pt did well and complained of feeling hot, but did not have any shortness of breath.  Pt had a little discomfort at costochondral region after doing more intense intervals. He also had pain when attempting to put pressure on his chest to lie on the pball.  Pt was educated in doing 5-10 minutes of intervals in his cardio to work on getting his heart rate up.  Pt will benefit from skilled PT to cotninue to porgress his strength and endurance.    PT Treatment/Interventions  ADLs/Self Care Home Management;Therapeutic activities;Therapeutic exercise;Neuromuscular re-education;Manual techniques;Patient/family education    PT Next Visit Plan  diaphragmatic breathing, posture strength, endurance and intervals on  equipment, functional strengthening    PT Home Exercise Plan  Access Code: [email or web address]    Consulted and Agree with Plan of Care  Patient       Patient will benefit from skilled therapeutic intervention in order to improve the following deficits and impairments:  Difficulty walking, Cardiopulmonary status limiting activity, Decreased endurance, Decreased activity tolerance, Impaired perceived functional ability, Decreased strength  Visit Diagnosis: Muscle weakness (generalized)  Difficulty in walking, not elsewhere classified     Problem List Patient Active Problem List   Diagnosis Date Noted  . History of 2019 novel coronavirus disease (COVID-19) 06/06/2019  . History of asthma 03/03/2019    Jule Ser, PT 07/04/2019, 4:21 PM  [facility] [address], [place], [place], [address] Phone: [phone]   Fax:  [phone]  Name: Tim Delgado MRN: [id number] Date of Birth: 1990-11-08

## 2019-07-06 ENCOUNTER — Encounter: Payer: Self-pay | Admitting: Physical Therapy

## 2019-07-06 ENCOUNTER — Ambulatory Visit: Payer: 59 | Admitting: Physical Therapy

## 2019-07-06 ENCOUNTER — Encounter: Payer: Self-pay | Admitting: Internal Medicine

## 2019-07-06 ENCOUNTER — Encounter: Payer: Self-pay | Admitting: Family Medicine

## 2019-07-06 ENCOUNTER — Ambulatory Visit (INDEPENDENT_AMBULATORY_CARE_PROVIDER_SITE_OTHER): Payer: 59 | Admitting: Internal Medicine

## 2019-07-06 ENCOUNTER — Other Ambulatory Visit: Payer: Self-pay

## 2019-07-06 VITALS — BP 126/72 | HR 103 | Temp 97.3°F | Ht 70.0 in | Wt 182.6 lb

## 2019-07-06 DIAGNOSIS — R0609 Other forms of dyspnea: Secondary | ICD-10-CM | POA: Diagnosis not present

## 2019-07-06 DIAGNOSIS — J4530 Mild persistent asthma, uncomplicated: Secondary | ICD-10-CM

## 2019-07-06 DIAGNOSIS — R262 Difficulty in walking, not elsewhere classified: Secondary | ICD-10-CM

## 2019-07-06 DIAGNOSIS — Z8616 Personal history of COVID-19: Secondary | ICD-10-CM

## 2019-07-06 DIAGNOSIS — M6281 Muscle weakness (generalized): Secondary | ICD-10-CM

## 2019-07-06 DIAGNOSIS — Z8619 Personal history of other infectious and parasitic diseases: Secondary | ICD-10-CM

## 2019-07-06 MED ORDER — FLOVENT HFA 220 MCG/ACT IN AERO: 1.0000 | INHALATION_SPRAY | Freq: Two times a day (BID) | RESPIRATORY_TRACT | 5 refills | Status: DC

## 2019-07-06 NOTE — Patient Instructions (Addendum)
The patient should have follow up scheduled with myself in 3 months.   Prior to next visit patient should have a full set of PFTs ASAP including:  Spirometry with bronchodilator if obstructed Lung Volumes DLCO FeNO      Start taking flovent 1 puff in the morning, 1 puff at night.  Gargle with warm water afterwards. Take the albuterol as needed and before activity to help with the breathing and heaviness.   ------------------------------------------------------------------------------------------------------------------- Metered Dose Inhaler (MDI) Instructions (Albuterol, Flovent)  Before using your inhaler for the first time: 1. Take the cap off the mouthpiece. 2. Shake the inhaler for 5 seconds 3. Press down on the canister to spray the medicine into the air. 4. Repeat these steps 3 more times. If you haven't used your inhaler in more than 2 weeks, repeat these steps before using it.  To use your inhaler: 1. Take the cap off the mouthpiece 2. Shake the inhaler for 5 seconds. 3. Hold it upright with your finger on the top of the canister and your thumb on the bottom of the inhaler. 4. Breathe out. 5. Close your lips around the mouthpiece. 6. As you start to inhale the next breath, press down on the canister. 7. Inhale deeply and slowly through your mouth. 8. Hold your breath for 5 to 10 seconds to keep the medicine in your lungs. 9. Let your breath out.   10. Repeat these steps if you are supposed to take 2 puffs.  11. Put the cap back on the mouthpiece 12. Remember to rinse, gargle and spit with water after use if your inhaler has a steroid in it (Advair, Symbicort, Dulera, Qvar, Flovent)  Caring for your MDI and chamber For most MDIs, remove the canister and rinse the plastic holder with warm running water once a week to prevent the holes from getting clogged. Shake well and let air dry. There are some medications in which the inhaler cannot be removed from the holder.  These usually need to be cleaned by wiping the mouthpiece with a cloth or cleaning with a dry cotton swab. Refer to the patient instructions that come with your inhaler. Clean the chamber about once a week. Remove the soft ring at the end of the chamber. Soak the spacer in warm water with a mild detergent. Carefully clean and, rinse, and shake off excess water. Do not hand dry. Allow to completely air dry. Do not store the chamber in a plastic bag.  Checking your MDI It is important that you know how much medication is left in your inhaler. The number of puffs contained in your MDI is printed on the side of the canister. After you have used that number of puffs, you must discard your inhaler even if it continues to spray. Keep track of how many puffs you have used. You also must include priming puffs in this total. If you use an MDI every day for control of symptoms, you can determine how long it will last by dividing the total number of puffs in the MDI by the total puffs you use every day. For example: 2 puffs x 2 times per day = 4 total puffs per day. At 120 puffs, the MDI will last 30 days. If you use an inhaler only when you need to, you must keep track of how many times you spray the inhaler. Some of the newer MDIs have counting devices built in.  If your MDI does not have a dose counter, you can  obtain a device that attaches to the MDI and counts down the number of puffs each time you press the inhaler. Ask your health care professional for more information about these devices, as well as how to best keep track of your medicine without an add-on device (if you prefer).

## 2019-07-06 NOTE — Progress Notes (Signed)
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  Improving SOB, but with sharp pains in lungs at times." Date of Consultation: 07/06/2019  Chief complaint:   Chief Complaint  Patient presents with  . Consult    pt dx'ed with Covid-19 01/2019, has experienced residual sob, anterior CP/tightness at rest and with exertion, difficulty taking deep inhale. hx childhood asthma.      HPI:  While he had cough and wheezing in July, no longer. He doesn't have any cough. No wheezing Chest pain across his chest which he feels at rest and gets worse with activity. Feels like a heaviness in his chest.  He has sharp stabbing pains in his ribs.  Worse with deep breathing.  He is currently going to physical therapy for myalgias and weakness.  No longer having fevers, chills.   Has a history of childhood asthma which he grew out of. He played sports and was in band was active. Went to the ED/hospital once as a young child maybe around 52 years old.    He was given an albuterol inhaler by PCP and he hasn't taken it in the last week. But it does help you.   Taking acetaminophen, 600 mg ibuprofen taking 1-2 times every other day.   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  Exposures: He lives alone at home, no pets.  Smoking history: He smokes cigars recreationally, not in the last 5 months, also has smoked marijuana in the past.  Born and grew up in [place], now lives in [place].   Social History   Occupational History  . Not on file  Tobacco Use  . Smoking status: Never Smoker  . Smokeless tobacco: Never Used  Substance and Sexual Activity  . Alcohol use: Yes    Alcohol/week: 4.0 standard drinks    Types: 4 Glasses of  wine per week  . Drug use: Never  . Sexual activity: Not on file    Relevant family history:  Family History  Problem Relation Age of Onset  . Sarcoidosis Mother   . Asthma Neg Hx     Past Medical History:  Diagnosis Date  . Childhood asthma   . COVID-19     History reviewed. No pertinent surgical history.   Review of systems: Review of Systems  Constitutional: Negative for chills, fever and weight loss.  HENT: Negative for congestion, sinus pain and sore throat.   Eyes: Negative for discharge and redness.  Respiratory: Negative for cough, hemoptysis, sputum production, shortness of breath and wheezing.   Cardiovascular: Negative for chest pain, palpitations and leg swelling.  Gastrointestinal: Negative for heartburn, nausea and vomiting.  Musculoskeletal: Positive for joint pain and myalgias.  Skin: Negative for rash.  Neurological: Negative for dizziness, tremors, focal weakness and headaches.  Endo/Heme/Allergies: Positive for environmental allergies.  Psychiatric/Behavioral: Negative for depression. The patient is nervous/anxious.   All other systems reviewed and are negative.   Physical Exam: Blood pressure 126/72, pulse (!) 103, temperature (!) 97.3 F (36.3 C), temperature source Temporal, height 5\' 10"  (1.778 m), weight 182 lb 9.6 oz (82.8 kg), SpO2 99 %. Gen:      No acute distress Eyes: EOMI, sclera anicteric ENT:  no nasal polyps,  mucus membranes moist Neck:     Supple, no thyromegaly Lungs:    Shallow inspiratory effort, symmetric chest wall excursion, clear to auscultation bilaterally, no wheezes or crackles CV:         Regular rate and rhythm; no murmurs, rubs, or gallops.  No pedal edema Abd:      + bowel sounds; soft, non-tender; no distension MSK: no acute synovitis of DIP or PIP joints, no mechanics hands.  Skin:      Warm and dry; no rashes Neuro: normal speech, no focal facial asymmetry Psych: alert and oriented x3, normal mood and affect  Data  Reviewed: Imaging: I have personally reviewed the chest x-ray from February 22, 2019.  The lung fields are clear, there is no hyperinflation.  The axilla is normal.  There is some scoliosis noted.  PFTs:  Not on file  Labs: He had some mild transaminitis on his labs from July 29th was attributed to Tylenol use and dehydration, they are downtrending. Immunization status:  There is no immunization history on file for this patient.   Assessment:  Tim Delgado is a 28 year old gentleman with a history of childhood asthma who presents now with persistent dyspnea chest tightness and heaviness after having COVID-19 infection 6 months ago.  1.  Dyspnea 2.  COVID-19 infection 3.  Mild persistent asthma   Plan/Recommendations: Given his chest heaviness and childhood asthma and response to albuterol, reasonable to start with a trial of inhaled corticosteroid.  I prescribed Flovent with pharmacy.  He should continue taking the albuterol up to 4 times a day as needed.  We have also counseled him on how to use a spacer with the Flovent.  We will get a full set of PFTs.  He should continue his therapy, and can take albuterol 15 minutes prior to exertional activity.  Continue supportive care for the rib pain.  Return to Care: Return in about 3 months (around 10/04/2019).  Durel Salts, MD Pulmonary and Critical Care Medicine  HealthCare [phone]  CC: Deeann Saint, MD

## 2019-07-06 NOTE — Therapy (Signed)
[facility] [address] [place], [place], [address] Phone: [phone]   Fax:  [phone]  Physical Therapy Treatment  Patient Details  Name: Tim Delgado MRN: [id number] Date of Birth: 1990/09/07 Referring Provider (PT): Dr. Grier Mitts    Encounter Date: 07/06/2019  PT End of Session - 07/06/19 1536    Visit Number  7    Date for PT Re-Evaluation  07/11/19    PT Start Time  1532    PT Stop Time  1614    PT Time Calculation (min)  42 min    Activity Tolerance  Patient tolerated treatment well    Behavior During Therapy  [facility] for tasks assessed/performed       Past Medical History:  Diagnosis Date  . Childhood asthma   . COVID-19     History reviewed. No pertinent surgical history.  There were no vitals filed for this visit.  Subjective Assessment - 07/06/19 1537    Subjective  Pt states he had his visit with the pulmonologist today and he was placed on medicine for asthma.  Pt states he will have to take another breathing test and it has to be sched                       [facility] Adult PT Treatment/Exercise - 07/06/19 0001      Lumbar Exercises: Aerobic   Elliptical  L6 interval      Shoulder Exercises: Standing   Extension  Strengthening;Both;20 reps;Weights    Extension Weight (lbs)  20    Row  Strengthening;Both;20 reps;Weights    Row Weight (lbs)  30    Diagonals  Strengthening;Both;20 reps;Weights    Diagonals Weight (lbs)  15    Other Standing Exercises  bicep curl into overhead press - 4lb      dead lift 4lb each hand Squat with 4lb Chest press supine with 4lb         PT Short Term Goals - 07/06/19 1558      PT SHORT TERM GOAL #1   Title  The patient will be able to walk 12-15 minutes  with perceived exertion <10 out of 20    Status  Achieved      PT SHORT TERM GOAL #2   Title  The patient will be able to participate in 30 minutes of low to moderate intensity  exercise with O2 saturation level at 95% or higher    Status  Achieved        PT Long Term Goals - 07/06/19 1544      PT LONG TERM GOAL #1   Title  The patient will be independent in safe self progression of HEP    Status  On-going      PT LONG TERM GOAL #2   Title  The patient will be able to walk 30 minutes with perceived exertional level 10 or below    Baseline  I am walking more, maybe 15-20 minutes and PRE is 9      PT LONG TERM GOAL #3   Title  The patient will have core, UE and LE strength to grossly 5-/5 needed to lift 15# for work duties    Status  On-going      PT LONG TERM GOAL #5   Title  Post Covid 19 Functional Status Scale improved to Grade 2 Slight Functional limitation level    Baseline  grade 2    Status  Achieved            Plan - 07/06/19 1618    Clinical Impression Statement  Pt has made progress and met several goals as seen above.  He is still only able to be up and active for about 3-4 hours before he feels pain and has to rest.  Pt is still unable to perform all the things needed to get back to work at this time.    PT Treatment/Interventions  ADLs/Self Care Home Management;Therapeutic activities;Therapeutic exercise;Neuromuscular re-education;Manual techniques;Patient/family education    PT Next Visit Plan  review HEP and goals, discharge with HEP and plan to continue working on endurance potentially next visit    PT Home Exercise Plan  Access Code: [email or web address]    Consulted and Agree with Plan of Care  Patient       Patient will benefit from skilled therapeutic intervention in order to improve the following deficits and impairments:  Difficulty walking, Cardiopulmonary status limiting activity, Decreased endurance, Decreased activity tolerance, Impaired perceived functional ability, Decreased strength  Visit Diagnosis: Muscle weakness (generalized)  Difficulty in walking, not elsewhere classified     Problem List Patient Active Problem List    Diagnosis Date Noted  . History of 2019 novel coronavirus disease (COVID-19) 06/06/2019  . History of asthma 03/03/2019    Jule Ser, PT 07/06/2019, 5:10 PM  [facility] [address] [place], [place], [address] Phone: [phone]   Fax:  [phone]  Name: Tim Delgado MRN: [id number] Date of Birth: [birth year]

## 2019-07-11 ENCOUNTER — Encounter: Payer: Self-pay | Admitting: Physical Therapy

## 2019-07-11 ENCOUNTER — Ambulatory Visit: Payer: 59 | Admitting: Physical Therapy

## 2019-07-11 ENCOUNTER — Other Ambulatory Visit: Payer: Self-pay

## 2019-07-11 DIAGNOSIS — R262 Difficulty in walking, not elsewhere classified: Secondary | ICD-10-CM

## 2019-07-11 DIAGNOSIS — M6281 Muscle weakness (generalized): Secondary | ICD-10-CM | POA: Diagnosis not present

## 2019-07-11 NOTE — Therapy (Addendum)
[facility] [address] [place], [place], [address] Phone: [phone]   Fax:  [phone]  Physical Therapy Treatment  Patient Details  Name: Tim Delgado MRN: [id number] Date of Birth: 1991-05-31 Referring Provider (PT): Dr. Grier Mitts    Encounter Date: 07/11/2019  PT End of Session - 07/11/19 1531    Visit Number  8    Date for PT Re-Evaluation  07/11/19    PT Start Time  0349    PT Stop Time  1614    PT Time Calculation (min)  43 min    Activity Tolerance  Patient tolerated treatment well    Behavior During Therapy  [facility] for tasks assessed/performed       Past Medical History:  Diagnosis Date  . Childhood asthma   . COVID-19     History reviewed. No pertinent surgical history.  There were no vitals filed for this visit.  Subjective Assessment - 07/11/19 1630    Subjective  Pt states he is discouraged because he feels the same and that no one has given him an explanation for his pain.  He reports overall PT has helped and he feels confident with his exercises.    Limitations  Walking;House hold activities    Currently in Pain?  No/denies         [facility] PT Assessment - 07/11/19 0001      Strength   Overall Strength Comments  UE/LE 5-/5 throughout - no increased pain      6 minute walk test results    Aerobic Endurance Distance Walked  1360    Endurance additional comments  9/20                   [facility] Adult PT Treatment/Exercise - 07/11/19 0001      Self-Care   Self-Care  Other Self-Care Comments    Other Self-Care Comments   educated and reviewed HEP as seen in chart - educated about diaphragm breathing and how related to the ribcage excursion      Lumbar Exercises: Supine   Other Supine Lumbar Exercises  thoracic extension towel roll - 10 x 5 sec      Knee/Hip Exercises: Aerobic   Nustep  L5 x 6 min - PT present for status update               PT Short Term  Goals - 07/06/19 1558      PT SHORT TERM GOAL #1   Title  The patient will be able to walk 12-15 minutes  with perceived exertion <10 out of 20    Status  Achieved      PT SHORT TERM GOAL #2   Title  The patient will be able to participate in 30 minutes of low to moderate intensity exercise with O2 saturation level at 95% or higher    Status  Achieved        PT Long Term Goals - 07/11/19 1539      PT LONG TERM GOAL #1   Title  The patient will be independent in safe self progression of HEP    Status  Achieved      PT LONG TERM GOAL #2   Title  The patient will be able to walk 30 minutes with perceived exertional level 10 or below    Baseline  I am walking more, maybe 15-20 minutes and PRE is 9    Status  Partially Met  PT LONG TERM GOAL #3   Title  The patient will have core, UE and LE strength to grossly 5-/5 needed to lift 15# for work duties    Baseline  at least5-/5 throughout and able to correctly lift 35lb from the floor with only feeling mild pain    Status  Achieved      PT LONG TERM GOAL #4   Title  Six minute walk test to 1550 feet indicating improved gait endurance    Baseline  1360, PRE 9    Status  Partially Met      PT LONG TERM GOAL #5   Title  Post Covid 19 Functional Status Scale improved to Grade 2 Slight Functional limitation level    Baseline  grade 2    Status  Achieved            Plan - 07/11/19 1620    Clinical Impression Statement  Pt has made progress but has hit a plateau as he reports he has still been about the same over the last couple of weeks. He met some goals.  He is still having pain.  PT discussed with patient about other things he can do to address pain.  Pt was recommended to continue working on HEP and work on thoracic mobility as pain appears to be related to costochondritis or joint related stiffness.  Pt went to pulmonologist.  PT recommends return to MD if he is still not getting better with PT and potentially get an  orthopedic to assess any abnormalities in the thoracic spine that may be causing the pain.  Pt will discharge today with HEP.    PT Treatment/Interventions  ADLs/Self Care Home Management;Therapeutic activities;Therapeutic exercise;Neuromuscular re-education;Manual techniques;Patient/family education    PT Next Visit Plan  discharge today    PT Home Exercise Plan  Access Code: [email or web address]    Consulted and Agree with Plan of Care  Patient       Patient will benefit from skilled therapeutic intervention in order to improve the following deficits and impairments:  Difficulty walking, Cardiopulmonary status limiting activity, Decreased endurance, Decreased activity tolerance, Impaired perceived functional ability, Decreased strength  Visit Diagnosis: Muscle weakness (generalized)  Difficulty in walking, not elsewhere classified     Problem List Patient Active Problem List   Diagnosis Date Noted  . History of 2019 novel coronavirus disease (COVID-19) 06/06/2019  . History of asthma 03/03/2019    Jule Ser, PT 07/11/2019, 4:40 PM  [facility] [address] [place], [place], [address] Phone: [phone]   Fax:  [phone]  Name: Tim Delgado MRN: [id number] Date of Birth: 28-Dec-1990  PHYSICAL THERAPY DISCHARGE SUMMARY  Visits from Start of Care: 8  Current functional level related to goals / functional outcomes: See above goals   Remaining deficits: See above   Education / Equipment: HEP  Plan: Patient agrees to discharge.  Patient goals were partially met. Patient is being discharged due to not returning since the last visit.  ?????     [employer], PT 09/25/19 10:54 AM

## 2019-07-12 ENCOUNTER — Telehealth: Payer: Self-pay | Admitting: Internal Medicine

## 2019-07-12 NOTE — Telephone Encounter (Signed)
Dr Mauricio Po patient. Why was this sent to me?

## 2019-07-12 NOTE — Telephone Encounter (Signed)
Pt called stating that Flovent is too expensive and is requesting something else to be prescribed in it's place. Dr. Shearon Stalls please advise on this for pt. Thanks!

## 2019-07-13 NOTE — Telephone Encounter (Signed)
Sent to Dr. Annamaria Boots in error. Dr. Shearon Stalls please advise on an alternative(s) for Flovent.  Thanks!

## 2019-07-14 MED ORDER — PULMICORT FLEXHALER 90 MCG/ACT IN AEPB: 1.0000 | INHALATION_SPRAY | Freq: Two times a day (BID) | RESPIRATORY_TRACT | 5 refills | Status: AC

## 2019-07-14 NOTE — Telephone Encounter (Signed)
Will route to pharmacy team to assist with an alternative.

## 2019-07-14 NOTE — Telephone Encounter (Signed)
Yes ok to switch to pulmicort flexhaler. Sent to pharmacy

## 2019-07-14 NOTE — Telephone Encounter (Signed)
Dr. Shearon Stalls please advise if you're ok with switching to pulmicort.  Thanks!

## 2019-07-14 NOTE — Telephone Encounter (Signed)
Ran test claim and Flovent HFA has a $82.15, there are no copay cards available for Flovent. Plan will pay for Pulmicort Flexihaler $50 copay and there is a copay card available online- patient would pay no more than $20.  Here are the [facility] billing details to include in the rx memo:  [id number] PCN-CN [phone] PQ-[phone]  Thanks! Beatriz Chancellor, CPhT

## 2019-07-17 NOTE — Telephone Encounter (Signed)
Called and spoke with patient at 10:55AM.  Informed patient to start Pulmicort Flexhaler and discontinue Flovent HFA. Briefly educated patient on differences between MDI and DPI inhalers. Discussed pricing and how pharmacy must order Pulmicort inhaler. Patient verbalized understanding. Offered patient to schedule appt with pharmacy team to discuss inhaler education. Patient politely declined and stated he would be more comfortable looking up inhaler education via youtube. Plan for patient to follow up with pharmacy team as needed.

## 2019-07-17 NOTE — Telephone Encounter (Signed)
Called [facility] and provided copay card information. Inhaler had to be ordered and should be filled today for patient. Routing to Pharmacist to discuss new inhaler device with patient.

## 2019-08-02 ENCOUNTER — Telehealth (INDEPENDENT_AMBULATORY_CARE_PROVIDER_SITE_OTHER): Payer: 59 | Admitting: Family Medicine

## 2019-08-02 ENCOUNTER — Encounter: Payer: Self-pay | Admitting: Family Medicine

## 2019-08-02 DIAGNOSIS — J4530 Mild persistent asthma, uncomplicated: Secondary | ICD-10-CM

## 2019-08-02 DIAGNOSIS — Z8616 Personal history of COVID-19: Secondary | ICD-10-CM

## 2019-08-02 DIAGNOSIS — R0602 Shortness of breath: Secondary | ICD-10-CM | POA: Diagnosis not present

## 2019-08-02 NOTE — Progress Notes (Signed)
Virtual Visit via Video Note  I connected with Tim Delgado on 08/02/19 at  2:00 PM EST by a video enabled telemedicine application 2/2 [id number]-45 pandemic and verified that I am speaking with the correct person using two identifiers.  Location patient: home Location provider:work or home office Persons participating in the virtual visit: patient, provider  I discussed the limitations of evaluation and management by telemedicine and the availability of in person appointments. The patient expressed understanding and agreed to proceed.   HPI: Pt is a 29 yo male with pmh sig for asthma and COVID-19 infection with prolonged recovery.     Pt seen for f/u on COVID-19 infection, dx'd 02/06/19.  Pt states he is doing ok.  States today is "challenging".  Has days where his energy is down some.  Working with PT to improve deconditioning and muscle strength.  Taking B complex vitamin, MVI, and turmeric to help with fatigue.  Pt has seen Pulmonology.  States PFTs are being scheduled 3 months out.  Pt waiting on a second opinion.  Still having soreness in chest and SOB at times.  Notices the SOB more at night when trying to lay down.  Pt has tried heat, ice, CBD oil.   Pt notes the HAs and nausea have resolved.  Pt's job is being understanding.  ROS: See pertinent positives and negatives per HPI.  Past Medical History:  Diagnosis Date  . Childhood asthma   . COVID-19     No past surgical history on file.  Family History  Problem Relation Age of Onset  . Sarcoidosis Mother   . Asthma Neg Hx       Current Outpatient Medications:  .  albuterol (VENTOLIN HFA) 108 (90 Base) MCG/ACT inhaler, Inhale 2 puffs into the lungs every 6 (six) hours as needed for wheezing or shortness of breath., Disp: 6.7 g, Rfl: 3 .  Ascorbic Acid (VITAMIN C) 100 MG tablet, Take 100 mg by mouth daily., Disp: , Rfl:  .  aspirin 325 MG tablet, Take 325 mg by mouth 2 (two) times a week., Disp: , Rfl:  .  b complex vitamins  tablet, Take 1 tablet by mouth daily., Disp: , Rfl:  .  Budesonide (PULMICORT FLEXHALER) 90 MCG/ACT inhaler, Inhale 1 puff into the lungs 2 (two) times daily., Disp: 1 each, Rfl: 5 .  Echinacea 125 MG CAPS, Take 1 capsule by mouth daily., Disp: , Rfl:  .  fluticasone (FLOVENT HFA) 220 MCG/ACT inhaler, Inhale 1 puff into the lungs 2 (two) times daily., Disp: 1 Inhaler, Rfl: 5 .  Ginger 500 MG CAPS, Take 1 capsule by mouth daily., Disp: , Rfl:  .  hydrOXYzine (VISTARIL) 100 MG capsule, Take 1 capsule (100 mg total) by mouth at bedtime as needed., Disp: 30 capsule, Rfl: 2 .  ibuprofen (ADVIL) 600 MG tablet, Take 1 tablet (600 mg total) by mouth every 8 (eight) hours as needed., Disp: 30 tablet, Rfl: 0 .  Multiple Vitamin (MULTIVITAMIN WITH MINERALS) TABS tablet, Take 1 tablet by mouth daily., Disp: , Rfl:  .  OVER THE COUNTER MEDICATION, Take 1 capsule by mouth daily. [place] supplement otc, Disp: , Rfl:  .  Turmeric 400 MG CAPS, Take 1 capsule by mouth 2 (two) times daily at 10 AM and 5 PM., Disp: , Rfl:  .  zinc gluconate 50 MG tablet, Take 50 mg by mouth daily., Disp: , Rfl:   EXAM:  VITALS per patient if applicable: RR between 80-99 bpm  GENERAL:  alert, oriented, appears well and in no acute distress  HEENT: atraumatic, conjunctiva clear, no obvious abnormalities on inspection of external nose and ears  NECK: normal movements of the head and neck  LUNGS: on inspection no signs of respiratory distress, breathing rate appears normal, no obvious gross SOB, gasping or wheezing  CV: no obvious cyanosis  MS: moves all visible extremities without noticeable abnormality  PSYCH/NEURO: pleasant and cooperative, no obvious depression or anxiety, speech and thought processing grossly intact  ASSESSMENT AND PLAN:  Discussed the following assessment and plan:  History of 2019 novel coronavirus disease (COVID-19) -prolonged recovery -continue PT to aid with deconditioning  Mild persistent  asthma without complication -continue flovent 220 mcg and albuterol inhaler prn -continue f/u with Pulm.  Shortness of breath -likely 2/2 h/o asthma, deconditioning a/w COVID-19 infection -continue PT -continue current inhalers -PFTs pending -awaiting 2nd opinion from Pulm.  F/u prn next wk.   I discussed the assessment and treatment plan with the patient. The patient was provided an opportunity to ask questions and all were answered. The patient agreed with the plan and demonstrated an understanding of the instructions.   The patient was advised to call back or seek an in-person evaluation if the symptoms worsen or if the condition fails to improve as anticipated.    Deeann Saint, MD

## 2019-08-07 ENCOUNTER — Encounter: Payer: Self-pay | Admitting: Family Medicine

## 2019-08-10 ENCOUNTER — Ambulatory Visit (INDEPENDENT_AMBULATORY_CARE_PROVIDER_SITE_OTHER): Payer: 59 | Admitting: Family Medicine

## 2019-08-10 ENCOUNTER — Encounter: Payer: Self-pay | Admitting: Family Medicine

## 2019-08-10 VITALS — BP 128/78 | HR 75 | Temp 97.9°F | Wt 182.0 lb

## 2019-08-10 DIAGNOSIS — R0602 Shortness of breath: Secondary | ICD-10-CM

## 2019-08-10 DIAGNOSIS — M25471 Effusion, right ankle: Secondary | ICD-10-CM | POA: Diagnosis not present

## 2019-08-10 DIAGNOSIS — R5383 Other fatigue: Secondary | ICD-10-CM

## 2019-08-10 DIAGNOSIS — Z8616 Personal history of COVID-19: Secondary | ICD-10-CM | POA: Diagnosis not present

## 2019-08-10 NOTE — Patient Instructions (Signed)

## 2019-08-10 NOTE — Progress Notes (Signed)
Subjective:    Patient ID: Tim Delgado, male    DOB: Jun 08, 1991, 29 y.o.   MRN: [id number]  No chief complaint on file.   HPI Patient is a pleasant 29 yo male with pmh sig for asthma and h/o COVID-19 with prolonged recovery seen today for f/u.  Pt states he is having trouble sleeping.  Notes took hydroxyzine last night but woke up after 4 hours.  Pt notes increased stress as 3 family members died 2/2 COVID.  Pt notes cough, SOB at night and chest soreness.  Was seen by Pulm.  Still with some fatigue.  PFTs scheduled in several months d/t staff shortage.    R ankle edema x 2 wks.  Ankle started swelling, pt fell a few days later on the same ankle.  Edema improving.  Past Medical History:  Diagnosis Date  . Childhood asthma   . COVID-19     No Known Allergies  ROS General: Denies fever, chills, night sweats, changes in weight, changes in appetite  +intermittent fatigue HEENT: Denies headaches, ear pain, changes in vision, rhinorrhea, sore throat CV: Denies CP, palpitations, orthopnea  +SOB Pulm: Denies wheezing +SOB, cough GI: Denies abdominal pain, nausea, vomiting, diarrhea, constipation GU: Denies dysuria, hematuria, frequency, vaginal discharge Msk: Denies muscle cramps, joint pains  +chest wall pain Neuro: Denies weakness, numbness, tingling Skin: Denies rashes, bruising Psych: Denies depression, anxiety, hallucinations    Objective:    Blood pressure 128/78, pulse 75, temperature 97.9 F (36.6 C), temperature source Temporal, weight 182 lb (82.6 kg), SpO2 96 %.  Gen. Pleasant, well-nourished, in no distress, normal affect  HEENT: [place]/AT, face symmetric, conjunctiva clear, no scleral icterus, PERRLA, EOMI, nares patent without drainage, pharynx without erythema or exudate.  TMs normal b/l Lungs: no accessory muscle use, CTAB, no wheezes or rales Cardiovascular: RRR, no m/r/g, no peripheral edema Abdomen: BS present, soft, NT/ND, no hepatosplenomegaly. Musculoskeletal: No TTP  of chest wall, sternum.  No deformities, no cyanosis or clubbing, normal tone Neuro:  A&Ox3, CN II-XII intact, normal gait Skin:  Warm, no lesions/ rash   Wt Readings from Last 3 Encounters:  08/10/19 182 lb (82.6 kg)  07/06/19 182 lb 9.6 oz (82.8 kg)  02/05/19 165 lb (74.8 kg)    Lab Results  Component Value Date   WBC 12.0 (H) 02/22/2019   HGB 13.6 02/22/2019   HCT 43.9 02/22/2019   PLT 228 02/22/2019   GLUCOSE 96 02/22/2019   ALT 90 (H) 02/22/2019   AST 74 (H) 02/22/2019   NA 137 02/22/2019   K 4.4 02/22/2019   CL 102 02/22/2019   CREATININE 0.86 02/22/2019   BUN 8 02/22/2019   CO2 26 02/22/2019    Assessment/Plan:  Personal history of covid-19  -dx'd 02/06/19  -prolonged recovery, but improving. -Fatigue, SOB, costochondritis, and insomnia remain - Plan: Ambulatory referral to Pulmonology  SOB Likely multifactorial:  H/o asthma, deconditioning, and possible brontinchitis 2/2 residual effects of COVID-19 infection  -LFTs pending? -pt would like 2nd Pulm opinion.   - Plan: Ambulatory referral to pulmonology  Fatigue, unspecified type -slowly improving  -2/2 h/o COVID-19 -unable to return to work at this time -continue walking and increasing activity as tolerated  Edema of right ankle -MSK cause, likely 2/2 sprain -discussed supportive care:  RICE, tylenol prn -continue to monitor  F/u prn  Abbe Amsterdam, MD

## 2019-08-14 ENCOUNTER — Telehealth: Payer: Self-pay | Admitting: Internal Medicine

## 2019-08-14 ENCOUNTER — Encounter: Payer: Self-pay | Admitting: Family Medicine

## 2019-08-14 NOTE — Telephone Encounter (Signed)
I called and spoke with the patient and to get information on what all was needed to be sent. He states his visit with Dr. Celine Mans. I have faxed her visit note to [employer]. Nothing further is needed.

## 2019-08-22 ENCOUNTER — Telehealth: Payer: Self-pay | Admitting: Family Medicine

## 2019-08-22 NOTE — Telephone Encounter (Signed)
Patient dropped off Attending Physician statement forms to have completed  Fax forms to: [phone] ATTN: [employer] Disability  Contact patient to pick up forms when completed and faxed.  Disposition: Dr's Folder

## 2019-08-24 ENCOUNTER — Encounter: Payer: Self-pay | Admitting: Family Medicine

## 2019-08-24 NOTE — Telephone Encounter (Signed)
Pt FMLA forms have been received and being reviewed by Dr Salomon Fick, pt will be notified when the forms are ready for pick up

## 2019-08-25 NOTE — Telephone Encounter (Signed)
Pt paperwork is complete and has been faxed to [employer], pt has been notified through my chart portal. A cpy is in the office for pt to pick up.

## 2019-09-25 ENCOUNTER — Encounter: Payer: Self-pay | Admitting: Family Medicine

## 2019-09-26 ENCOUNTER — Other Ambulatory Visit (HOSPITAL_COMMUNITY)
Admission: RE | Admit: 2019-09-26 | Discharge: 2019-09-26 | Disposition: A | Discharge status: Home / Self Care | Payer: 59 | Source: Ambulatory Visit | Attending: Internal Medicine | Admitting: Internal Medicine

## 2019-09-26 DIAGNOSIS — Z20822 Contact with and (suspected) exposure to covid-19: Secondary | ICD-10-CM | POA: Diagnosis not present

## 2019-09-26 DIAGNOSIS — Z01812 Encounter for preprocedural laboratory examination: Secondary | ICD-10-CM | POA: Insufficient documentation

## 2019-09-26 LAB — SARS CORONAVIRUS 2 (TAT 6-24 HRS): SARS Coronavirus 2: NEGATIVE

## 2019-09-29 ENCOUNTER — Ambulatory Visit (INDEPENDENT_AMBULATORY_CARE_PROVIDER_SITE_OTHER): Payer: 59 | Admitting: Internal Medicine

## 2019-09-29 ENCOUNTER — Encounter: Payer: Self-pay | Admitting: Internal Medicine

## 2019-09-29 ENCOUNTER — Other Ambulatory Visit: Payer: Self-pay

## 2019-09-29 ENCOUNTER — Other Ambulatory Visit: Payer: Self-pay | Admitting: *Deleted

## 2019-09-29 VITALS — BP 120/72 | HR 85 | Temp 98.0°F | Ht 70.0 in | Wt 180.2 lb

## 2019-09-29 DIAGNOSIS — Z8616 Personal history of COVID-19: Secondary | ICD-10-CM

## 2019-09-29 DIAGNOSIS — J4540 Moderate persistent asthma, uncomplicated: Secondary | ICD-10-CM | POA: Diagnosis not present

## 2019-09-29 DIAGNOSIS — U071 COVID-19: Secondary | ICD-10-CM | POA: Diagnosis not present

## 2019-09-29 DIAGNOSIS — R079 Chest pain, unspecified: Secondary | ICD-10-CM

## 2019-09-29 DIAGNOSIS — R5383 Other fatigue: Secondary | ICD-10-CM

## 2019-09-29 DIAGNOSIS — J4530 Mild persistent asthma, uncomplicated: Secondary | ICD-10-CM

## 2019-09-29 LAB — PULMONARY FUNCTION TEST
DL/VA % pred: 100 %
DL/VA: 5.01 ml/min/mmHg/L
DLCO cor % pred: 74 %
DLCO cor: 24.54 ml/min/mmHg
DLCO unc % pred: 74 %
DLCO unc: 24.54 ml/min/mmHg
FEF 25-75 Post: 2.88 L/sec
FEF 25-75 Pre: 3.37 L/sec
FEF2575-%Change-Post: -14 %
FEF2575-%Pred-Post: 67 %
FEF2575-%Pred-Pre: 79 %
FEV1-%Change-Post: -5 %
FEV1-%Pred-Post: 77 %
FEV1-%Pred-Pre: 82 %
FEV1-Post: 3 L
FEV1-Pre: 3.18 L
FEV1FVC-%Change-Post: 3 %
FEV1FVC-%Pred-Pre: 101 %
FEV6-%Change-Post: -10 %
FEV6-%Pred-Post: 73 %
FEV6-%Pred-Pre: 82 %
FEV6-Post: 3.36 L
FEV6-Pre: 3.74 L
FEV6FVC-%Pred-Post: 101 %
FEV6FVC-%Pred-Pre: 101 %
FVC-%Change-Post: -8 %
FVC-%Pred-Post: 74 %
FVC-%Pred-Pre: 81 %
FVC-Post: 3.42 L
FVC-Pre: 3.74 L
Post FEV1/FVC ratio: 88 %
Post FEV6/FVC ratio: 100 %
Pre FEV1/FVC ratio: 85 %
Pre FEV6/FVC Ratio: 100 %
RV % pred: 82 %
RV: 1.3 L
TLC % pred: 74 %
TLC: 5.12 L

## 2019-09-29 LAB — NITRIC OXIDE: Nitric Oxide: 22

## 2019-09-29 MED ORDER — ALBUTEROL SULFATE HFA 108 (90 BASE) MCG/ACT IN AERS: 2.0000 | INHALATION_SPRAY | Freq: Four times a day (QID) | RESPIRATORY_TRACT | 5 refills | Status: AC | PRN

## 2019-09-29 MED ORDER — FLOVENT HFA 220 MCG/ACT IN AERO: 1.0000 | INHALATION_SPRAY | Freq: Two times a day (BID) | RESPIRATORY_TRACT | 5 refills | Status: AC

## 2019-09-29 NOTE — Progress Notes (Signed)
Tim Delgado    [id number]    04/06/1991  Primary Care Physician:Banks, Bettey Mare, MD Date of Appointment: 09/29/2019 Established Patient Visit  Chief complaint:   Chief Complaint  Patient presents with  . Follow-up    Patient reports that he is doing ok with his breathing and nothing has changed.    HPI: History of COVID 19 infection.  Childhood asthma. Residual shortness of breath, fatigue  Interval Updates: At initial visit given ICS as a trial. Here for follow up after PFTs.  He is taking flovent 1 puff in morning, 1 puff at night.  Taking albuterol 1-2 times/week.  Feels a catch and pain across the front of his chest.  Overall Has been getting better with time and does notice a difference with flovent. Still having fatigue and sleep problems. Getting good sleep has been a problem. Concerned about when he will feel well enough to go work.    Asthma Control Test ACT Total Score  09/29/2019 12   I have reviewed the patient's family social and past medical history and updated as appropriate.   Past Medical History:  Diagnosis Date  . Childhood asthma   . COVID-19     History reviewed. No pertinent surgical history.  Family History  Problem Relation Age of Onset  . Sarcoidosis Mother   . Asthma Neg Hx     Social History   Occupational History  . Not on file  Tobacco Use  . Smoking status: Never Smoker  . Smokeless tobacco: Never Used  Substance and Sexual Activity  . Alcohol use: Yes    Alcohol/week: 4.0 standard drinks    Types: 4 Glasses of wine per week  . Drug use: Never  . Sexual activity: Not on file    Review of systems: Constitutional: No fevers, chills, night sweats, or weight loss. CV: No palpitations. Resp: No hemoptysis.  Physical Exam: Blood pressure 120/72, pulse 85, temperature 98 F (36.7 C), temperature source Temporal, height 5\' 10"  (1.778 m), weight 180 lb 3.2 oz (81.7 kg), SpO2 98 %.  Gen:      No acute distress ENT:   no nasal polyps, mucus membranes moist Lungs:    No increased respiratory effort, symmetric chest wall excursion, clear to auscultation bilaterally, no wheezes or crackles CV:         Regular rate and rhythm; no murmurs, rubs, or gallops.  No pedal edema   Data Reviewed: Imaging: I have personally reviewed the chest imaging from July 2020 post-covid which is without acute cardiopulmonary process.   PFTs:  PFT Results Latest Ref Rng & Units 09/29/2019  FVC-Pre L 3.74  FVC-Predicted Pre % 81  FVC-Post L 3.42  FVC-Predicted Post % 74  Pre FEV1/FVC % % 85  Post FEV1/FCV % % 88  FEV1-Pre L 3.18  FEV1-Predicted Pre % 82  FEV1-Post L 3.00  DLCO UNC% % 74  DLCO COR %Predicted % 100  TLC L 5.12  TLC % Predicted % 74  RV % Predicted % 82   I have personally reviewed the patient's PFTs and there is normal spirometry without bronchodilator response. Lung volumes are normal. Diffusion capacity is normal. FeNO is not elevated at only 20 ppb.   Labs:  Lab Results  Component Value Date   WBC 12.0 (H) 02/22/2019   HGB 13.6 02/22/2019   HCT 43.9 02/22/2019   MCV 92.8 02/22/2019   PLT 228 02/22/2019    Immunization status:  There is no immunization history on file for this patient.  Assessment:  COVID 19 infection - July 2020 Moderate Persistent Asthma without exacerbation   Plan/Recommendations: Refilled flovent today. Would recommend he stay on this as long as he is using albuterol at least twice/day.  We gave him a spacer today.  Refilled albuterol as well today Normal PFTs today. FeNO not elevated. Discussed how this does not prove he doesn't have symptoms, or doesn't even have asthma. We are ruling out chronic lung conditions or even scarring related to his covid.   I spent 31 minutes on 09/29/2019 in care of this patient including face to face time and non-face to face time spent charting, review of outside records, and coordination of care.  I offered him prn follow up vs  scheduled appt. He would like to make an appt for now.   Return to Care: Return in about 4 months (around 01/29/2020).   Lenice Llamas, MD Pulmonary and [place]

## 2019-09-29 NOTE — Progress Notes (Signed)
PFT done today. 

## 2019-09-29 NOTE — Patient Instructions (Addendum)
The patient should have follow up scheduled 4 months.

## 2019-10-03 ENCOUNTER — Ambulatory Visit: Payer: 59 | Admitting: Family Medicine

## 2019-10-04 ENCOUNTER — Encounter: Payer: Self-pay | Admitting: Family Medicine

## 2019-10-04 ENCOUNTER — Ambulatory Visit (INDEPENDENT_AMBULATORY_CARE_PROVIDER_SITE_OTHER): Payer: 59 | Admitting: Family Medicine

## 2019-10-04 ENCOUNTER — Other Ambulatory Visit: Payer: Self-pay

## 2019-10-04 VITALS — BP 110/76 | HR 100 | Temp 98.4°F | Wt 185.0 lb

## 2019-10-04 DIAGNOSIS — G4709 Other insomnia: Secondary | ICD-10-CM | POA: Diagnosis not present

## 2019-10-04 DIAGNOSIS — Z0000 Encounter for general adult medical examination without abnormal findings: Secondary | ICD-10-CM | POA: Diagnosis not present

## 2019-10-04 DIAGNOSIS — Z131 Encounter for screening for diabetes mellitus: Secondary | ICD-10-CM

## 2019-10-04 DIAGNOSIS — E538 Deficiency of other specified B group vitamins: Secondary | ICD-10-CM

## 2019-10-04 DIAGNOSIS — Z13220 Encounter for screening for lipoid disorders: Secondary | ICD-10-CM | POA: Diagnosis not present

## 2019-10-04 DIAGNOSIS — R5383 Other fatigue: Secondary | ICD-10-CM

## 2019-10-04 DIAGNOSIS — Z8616 Personal history of COVID-19: Secondary | ICD-10-CM

## 2019-10-04 DIAGNOSIS — E559 Vitamin D deficiency, unspecified: Secondary | ICD-10-CM

## 2019-10-04 LAB — BASIC METABOLIC PANEL
BUN: 8 mg/dL (ref 6–23)
CO2: 30 mEq/L (ref 19–32)
Calcium: 9.2 mg/dL (ref 8.4–10.5)
Chloride: 101 mEq/L (ref 96–112)
Creatinine, Ser: 0.9 mg/dL (ref 0.40–1.50)
GFR: 121.57 mL/min (ref >60.00)
Glucose, Bld: 92 mg/dL (ref 70–99)
Potassium: 3.9 mEq/L (ref 3.5–5.1)
Sodium: 134 mEq/L — ABNORMAL LOW (ref 135–145)

## 2019-10-04 LAB — VITAMIN D 25 HYDROXY (VIT D DEFICIENCY, FRACTURES): VITD: 11.91 ng/mL — ABNORMAL LOW (ref 30.00–100.00)

## 2019-10-04 LAB — LIPID PANEL
Cholesterol: 125 mg/dL (ref 0–200)
HDL: 26.8 mg/dL — ABNORMAL LOW (ref >39.00)
LDL Cholesterol: 75 mg/dL (ref 0–99)
NonHDL: 98.66
Total CHOL/HDL Ratio: 5
Triglycerides: 119 mg/dL (ref 0.0–149.0)
VLDL: 23.8 mg/dL (ref 0.0–40.0)

## 2019-10-04 LAB — CBC
HCT: 37 % — ABNORMAL LOW (ref 39.0–52.0)
Hemoglobin: 12.3 g/dL — ABNORMAL LOW (ref 13.0–17.0)
MCHC: 33.1 g/dL (ref 30.0–36.0)
MCV: 90.5 fl (ref 78.0–100.0)
Platelets: 302 10*3/uL (ref 150.0–400.0)
RBC: 4.09 Mil/uL — ABNORMAL LOW (ref 4.22–5.81)
RDW: 15.3 % (ref 11.5–15.5)
WBC: 4.8 10*3/uL (ref 4.0–10.5)

## 2019-10-04 LAB — T4, FREE: Free T4: 0.78 ng/dL (ref 0.60–1.60)

## 2019-10-04 LAB — VITAMIN B12: Vitamin B-12: 106 pg/mL — ABNORMAL LOW (ref 211–911)

## 2019-10-04 LAB — HEMOGLOBIN A1C: Hgb A1c MFr Bld: 5.1 % (ref 4.6–6.5)

## 2019-10-04 LAB — TSH: TSH: 2.73 u[IU]/mL (ref 0.35–4.50)

## 2019-10-04 MED ORDER — VITAMIN D (ERGOCALCIFEROL) 1.25 MG (50000 UNIT) PO CAPS: 50000.0000 [IU] | ORAL_CAPSULE | ORAL | 0 refills | Status: DC

## 2019-10-04 MED ORDER — RAMELTEON 8 MG PO TABS: 8.0000 mg | ORAL_TABLET | Freq: Every day | ORAL | 0 refills | Status: DC

## 2019-10-04 NOTE — Patient Instructions (Signed)
Preventive Care 19-28 Years Old, Male Preventive care refers to lifestyle choices and visits with your health care provider that can promote health and wellness. This includes:  A yearly physical exam. This is also called an annual well check.  Regular dental and eye exams.  Immunizations.  Screening for certain conditions.  Healthy lifestyle choices, such as eating a healthy diet, getting regular exercise, not using drugs or products that contain nicotine and tobacco, and limiting alcohol use. What can I expect for my preventive care visit? Physical exam Your health care provider will check:  Height and weight. These may be used to calculate body mass index (BMI), which is a measurement that tells if you are at a healthy weight.  Heart rate and blood pressure.  Your skin for abnormal spots. Counseling Your health care provider may ask you questions about:  Alcohol, tobacco, and drug use.  Emotional well-being.  Home and relationship well-being.  Sexual activity.  Eating habits.  Work and work Statistician. What immunizations do I need?  Influenza (flu) vaccine  This is recommended every year. Tetanus, diphtheria, and pertussis (Tdap) vaccine  You may need a Td booster every 10 years. Varicella (chickenpox) vaccine  You may need this vaccine if you have not already been vaccinated. Human papillomavirus (HPV) vaccine  If recommended by your health care provider, you may need three doses over 6 months. Measles, mumps, and rubella (MMR) vaccine  You may need at least one dose of MMR. You may also need a second dose. Meningococcal conjugate (MenACWY) vaccine  One dose is recommended if you are 45-76 years old and a Market researcher living in a residence hall, or if you have one of several medical conditions. You may also need additional booster doses. Pneumococcal conjugate (PCV13) vaccine  You may need this if you have certain conditions and were not  previously vaccinated. Pneumococcal polysaccharide (PPSV23) vaccine  You may need one or two doses if you smoke cigarettes or if you have certain conditions. Hepatitis A vaccine  You may need this if you have certain conditions or if you travel or work in places where you may be exposed to hepatitis A. Hepatitis B vaccine  You may need this if you have certain conditions or if you travel or work in places where you may be exposed to hepatitis B. Haemophilus influenzae type b (Hib) vaccine  You may need this if you have certain risk factors. You may receive vaccines as individual doses or as more than one vaccine together in one shot (combination vaccines). Talk with your health care provider about the risks and benefits of combination vaccines. What tests do I need? Blood tests  Lipid and cholesterol levels. These may be checked every 5 years starting at age 17.  Hepatitis C test.  Hepatitis B test. Screening   Diabetes screening. This is done by checking your blood sugar (glucose) after you have not eaten for a while (fasting).  Sexually transmitted disease (STD) testing. Talk with your health care provider about your test results, treatment options, and if necessary, the need for more tests. Follow these instructions at home: Eating and drinking   Eat a diet that includes fresh fruits and vegetables, whole grains, lean protein, and low-fat dairy products.  Take vitamin and mineral supplements as recommended by your health care provider.  Do not drink alcohol if your health care provider tells you not to drink.  If you drink alcohol: ? Limit how much you have to 0-2  drinks a day. ? Be aware of how much alcohol is in your drink. In the [place] one drink equals one 12 oz bottle of beer (355 mL), one 5 oz glass of wine (148 mL), or one 1 oz glass of hard liquor (44 mL). Lifestyle  Take daily care of your teeth and gums.  Stay active. Exercise for at least 30 minutes on 5 or  more days each week.  Do not use any products that contain nicotine or tobacco, such as cigarettes, e-cigarettes, and chewing tobacco. If you need help quitting, ask your health care provider.  If you are sexually active, practice safe sex. Use a condom or other form of protection to prevent STIs (sexually transmitted infections). What's next?  Go to your health care provider once a year for a well check visit.  Ask your health care provider how often you should have your eyes and teeth checked.  Stay up to date on all vaccines. This information is not intended to replace advice given to you by your health care provider. Make sure you discuss any questions you have with your health care provider. Document Revised: 07/07/2018 Document Reviewed: 07/07/2018 Elsevier Patient Education  [place].  Fatigue If you have fatigue, you feel tired all the time and have a lack of energy or a lack of motivation. Fatigue may make it difficult to start or complete tasks because of exhaustion. In general, occasional or mild fatigue is often a normal response to activity or life. However, long-lasting (chronic) or extreme fatigue may be a symptom of a medical condition. Follow these instructions at home: General instructions  Watch your fatigue for any changes.  Go to bed and get up at the same time every day.  Avoid fatigue by pacing yourself during the day and getting enough sleep at night.  Maintain a healthy weight. Medicines  Take over-the-counter and prescription medicines only as told by your health care provider.  Take a multivitamin, if told by your health care provider.  Do not use herbal or dietary supplements unless they are approved by your health care provider. Activity   Exercise regularly, as told by your health care provider.  Use or practice techniques to help you relax, such as yoga, tai chi, meditation, or massage therapy. Eating and drinking   Avoid heavy  meals in the evening.  Eat a well-balanced diet, which includes lean proteins, whole grains, plenty of fruits and vegetables, and low-fat dairy products.  Avoid consuming too much caffeine.  Avoid the use of alcohol.  Drink enough fluid to keep your urine pale yellow. Lifestyle  Change situations that cause you stress. Try to keep your work and personal schedule in balance.  Do not use any products that contain nicotine or tobacco, such as cigarettes and e-cigarettes. If you need help quitting, ask your health care provider.  Do not use drugs. Contact a health care provider if:  Your fatigue does not get better.  You have a fever.  You suddenly lose or gain weight.  You have headaches.  You have trouble falling asleep or sleeping through the night.  You feel angry, guilty, anxious, or sad.  You are unable to have a bowel movement (constipation).  Your skin is dry.  You have swelling in your legs or another part of your body. Get help right away if:  You feel confused.  Your vision is blurry.  You feel faint or you pass out.  You have a severe headache.  You have severe pain in your abdomen, your back, or the area between your waist and hips (pelvis).  You have chest pain, shortness of breath, or an irregular or fast heartbeat.  You are unable to urinate, or you urinate less than normal.  You have abnormal bleeding, such as bleeding from the rectum, vagina, nose, lungs, or nipples.  You vomit blood.  You have thoughts about hurting yourself or others. If you ever feel like you may hurt yourself or others, or have thoughts about taking your own life, get help right away. You can go to your nearest emergency department or call:  Your local emergency services (911 in the [place]  A suicide crisis helpline, such as the [place] [facility] at 8732923455. This is open 24 hours a day. Summary  If you have fatigue, you feel tired all the time  and have a lack of energy or a lack of motivation.  Fatigue may make it difficult to start or complete tasks because of exhaustion.  Long-lasting (chronic) or extreme fatigue may be a symptom of a medical condition.  Exercise regularly, as told by your health care provider.  Change situations that cause you stress. Try to keep your work and personal schedule in balance. This information is not intended to replace advice given to you by your health care provider. Make sure you discuss any questions you have with your health care provider. Document Revised: 02/01/2019 Document Reviewed: 04/07/2017 Elsevier Patient Education  [place].  Insomnia Insomnia is a sleep disorder that makes it difficult to fall asleep or stay asleep. Insomnia can cause fatigue, low energy, difficulty concentrating, mood swings, and poor performance at work or school. There are three different ways to classify insomnia:  Difficulty falling asleep.  Difficulty staying asleep.  Waking up too early in the morning. Any type of insomnia can be long-term (chronic) or short-term (acute). Both are common. Short-term insomnia usually lasts for three months or less. Chronic insomnia occurs at least three times a week for longer than three months. What are the causes? Insomnia may be caused by another condition, situation, or substance, such as:  Anxiety.  Certain medicines.  Gastroesophageal reflux disease (GERD) or other gastrointestinal conditions.  Asthma or other breathing conditions.  Restless legs syndrome, sleep apnea, or other sleep disorders.  Chronic pain.  Menopause.  Stroke.  Abuse of alcohol, tobacco, or illegal drugs.  Mental health conditions, such as depression.  Caffeine.  Neurological disorders, such as Alzheimer's disease.  An overactive thyroid (hyperthyroidism). Sometimes, the cause of insomnia may not be known. What increases the risk? Risk factors for insomnia  include:  Gender. Women are affected more often than men.  Age. Insomnia is more common as you get older.  Stress.  Lack of exercise.  Irregular work schedule or working night shifts.  Traveling between different time zones.  Certain medical and mental health conditions. What are the signs or symptoms? If you have insomnia, the main symptom is having trouble falling asleep or having trouble staying asleep. This may lead to other symptoms, such as:  Feeling fatigued or having low energy.  Feeling nervous about going to sleep.  Not feeling rested in the morning.  Having trouble concentrating.  Feeling irritable, anxious, or depressed. How is this diagnosed? This condition may be diagnosed based on:  Your symptoms and medical history. Your health care provider may ask about: ? Your sleep habits. ? Any medical conditions you have. ? Your mental health.  A physical exam. How is this treated? Treatment for insomnia depends on the cause. Treatment may focus on treating an underlying condition that is causing insomnia. Treatment may also include:  Medicines to help you sleep.  Counseling or therapy.  Lifestyle adjustments to help you sleep better. Follow these instructions at home: Eating and drinking   Limit or avoid alcohol, caffeinated beverages, and cigarettes, especially close to bedtime. These can disrupt your sleep.  Do not eat a large meal or eat spicy foods right before bedtime. This can lead to digestive discomfort that can make it hard for you to sleep. Sleep habits   Keep a sleep diary to help you and your health care provider figure out what could be causing your insomnia. Write down: ? When you sleep. ? When you wake up during the night. ? How well you sleep. ? How rested you feel the next day. ? Any side effects of medicines you are taking. ? What you eat and drink.  Make your bedroom a dark, comfortable place where it is easy to fall  asleep. ? Put up shades or blackout curtains to block light from outside. ? Use a white noise machine to block noise. ? Keep the temperature cool.  Limit screen use before bedtime. This includes: ? Watching TV. ? Using your smartphone, tablet, or computer.  Stick to a routine that includes going to bed and waking up at the same times every day and night. This can help you fall asleep faster. Consider making a quiet activity, such as reading, part of your nighttime routine.  Try to avoid taking naps during the day so that you sleep better at night.  Get out of bed if you are still awake after 15 minutes of trying to sleep. Keep the lights down, but try reading or doing a quiet activity. When you feel sleepy, go back to bed. General instructions  Take over-the-counter and prescription medicines only as told by your health care provider.  Exercise regularly, as told by your health care provider. Avoid exercise starting several hours before bedtime.  Use relaxation techniques to manage stress. Ask your health care provider to suggest some techniques that may work well for you. These may include: ? Breathing exercises. ? Routines to release muscle tension. ? Visualizing peaceful scenes.  Make sure that you drive carefully. Avoid driving if you feel very sleepy.  Keep all follow-up visits as told by your health care provider. This is important. Contact a health care provider if:  You are tired throughout the day.  You have trouble in your daily routine due to sleepiness.  You continue to have sleep problems, or your sleep problems get worse. Get help right away if:  You have serious thoughts about hurting yourself or someone else. If you ever feel like you may hurt yourself or others, or have thoughts about taking your own life, get help right away. You can go to your nearest emergency department or call:  Your local emergency services (911 in the [place]  A suicide crisis  helpline, such as the [place] at (838)432-8234. This is open 24 hours a day. Summary  Insomnia is a sleep disorder that makes it difficult to fall asleep or stay asleep.  Insomnia can be long-term (chronic) or short-term (acute).  Treatment for insomnia depends on the cause. Treatment may focus on treating an underlying condition that is causing insomnia.  Keep a sleep diary to help you and your health care provider  figure out what could be causing your insomnia. This information is not intended to replace advice given to you by your health care provider. Make sure you discuss any questions you have with your health care provider. Document Revised: 06/25/2017 Document Reviewed: 04/22/2017 Elsevier Patient Education  2020 [employer].

## 2019-10-04 NOTE — Progress Notes (Signed)
Subjective:     Saurabh Hettich is a 29 y.o. male and is here for a comprehensive physical exam. The patient reports problems - insomnia, SOB, fatigue s/p COVID infection.  Pt still dealing with post COVID symptoms.  Hydroxyzine no longer helping with sleep.  Pt states was up late last night 2/2 insomnia.  Pt with intermittent SOB.  Recent PFTs normal.  Advised to continue flovent and albuterol prn.  Pt still doing PT exercises.   Inquires about having vitamins levels checked.  Social History   Socioeconomic History  . Marital status: Single    Spouse name: Not on file  . Number of children: Not on file  . Years of education: Not on file  . Highest education level: Not on file  Occupational History  . Not on file  Tobacco Use  . Smoking status: Never Smoker  . Smokeless tobacco: Never Used  Substance and Sexual Activity  . Alcohol use: Yes    Alcohol/week: 4.0 standard drinks    Types: 4 Glasses of wine per week  . Drug use: Never  . Sexual activity: Not on file  Other Topics Concern  . Not on file  Social History Narrative  . Not on file   Social Determinants of Health   Financial Resource Strain:   . Difficulty of Paying Living Expenses: Not on file  Food Insecurity:   . Worried About Programme researcher, broadcasting/film/video in the Last Year: Not on file  . Ran Out of Food in the Last Year: Not on file  Transportation Needs:   . Lack of Transportation (Medical): Not on file  . Lack of Transportation (Non-Medical): Not on file  Physical Activity:   . Days of Exercise per Week: Not on file  . Minutes of Exercise per Session: Not on file  Stress:   . Feeling of Stress : Not on file  Social Connections:   . Frequency of Communication with Friends and Family: Not on file  . Frequency of Social Gatherings with Friends and Family: Not on file  . Attends Religious Services: Not on file  . Active Member of Clubs or Organizations: Not on file  . Attends Banker Meetings: Not on file  .  Marital Status: Not on file  Intimate Partner Violence:   . Fear of Current or Ex-Partner: Not on file  . Emotionally Abused: Not on file  . Physically Abused: Not on file  . Sexually Abused: Not on file   Health Maintenance  Topic Date Due  . HIV Screening  09/29/2006  . TETANUS/TDAP  09/29/2010  . INFLUENZA VACCINE  02/25/2019    The following portions of the patient's history were reviewed and updated as appropriate: allergies, current medications, past family history, past medical history, past social history, past surgical history and problem list.  Review of Systems Pertinent items noted in HPI and remainder of comprehensive ROS otherwise negative.   Objective:    BP 110/76 (BP Location: Left Arm, Patient Position: Sitting, Cuff Size: Normal)   Pulse 100   Temp 98.4 F (36.9 C) (Temporal)   Wt 185 lb (83.9 kg)   SpO2 96%   BMI 26.54 kg/m  General appearance: alert, cooperative and no distress Head: Normocephalic, without obvious abnormality, atraumatic Eyes: conjunctivae/corneas clear. PERRL, EOM's intact. Fundi benign. Ears: normal TM's and external ear canals both ears Nose: Nares normal. Septum midline. Mucosa normal. No drainage or sinus tenderness. Throat: lips, mucosa, and tongue normal; teeth and gums normal Neck:  no adenopathy, no carotid bruit, no JVD, supple, symmetrical, trachea midline and thyroid not enlarged, symmetric, no tenderness/mass/nodules Lungs: clear to auscultation bilaterally Heart: regular rate and rhythm, S1, S2 normal, no murmur, click, rub or gallop Abdomen: soft, non-tender; bowel sounds normal; no masses,  no organomegaly Extremities: extremities normal, atraumatic, no cyanosis or edema Pulses: 2+ and symmetric Skin: Skin color, texture, turgor normal. No rashes or lesions Lymph nodes: Cervical, supraclavicular, and axillary nodes normal. Neurologic: Alert and oriented X 3, normal strength and tone. Normal symmetric reflexes. Normal  coordination and gait    Assessment:    Healthy male exam.      Plan:     Anticipatory guidance given including wearing seatbelts, smoke detectors in the home, increasing physical activity, increasing p.o. intake of water and vegetables. -will obtain labs -given handout -next CPE in 1 yr See After Visit Summary for Counseling Recommendations    Other insomnia  -will d/c hydroxyzine 100 mg -sleep hygiene -given handout - Plan: ramelteon (ROZEREM) 8 MG tablet, TSH, T4, Free  Screening for cholesterol level  - Plan: Lipid panel  Screening for diabetes mellitus  - Plan: Hemoglobin A1c  Other fatigue  - Plan: Vitamin D, 25-hydroxy, Vitamin B12  History of COVID -residual effects slowly improving with time -will continue to assess pt's ability to return to work.  Will likely need reduced schedule.  Update: Vitamin D deficiency and Vitamin B12 deficiency -Vitamin B12 low at 106, vitamin D low at 11.91.  Rx for ergocalciferol sent to pharmacy.  Pt to get cyanocobalamin IM monthly at clinic x 3 months, then switch to po vit b12.  F/u prn in one month  Grier Mitts, MD

## 2019-10-10 ENCOUNTER — Telehealth: Payer: Self-pay | Admitting: Family Medicine

## 2019-10-10 NOTE — Telephone Encounter (Signed)
Pt said that he was returning a call from [place] and would like a call back.

## 2019-10-11 NOTE — Telephone Encounter (Signed)
Spoke with pt scheduled for his first B12 injection on 10/12/2019 at 10.45 am

## 2019-10-12 ENCOUNTER — Ambulatory Visit (INDEPENDENT_AMBULATORY_CARE_PROVIDER_SITE_OTHER): Payer: 59 | Admitting: *Deleted

## 2019-10-12 ENCOUNTER — Other Ambulatory Visit: Payer: Self-pay

## 2019-10-12 DIAGNOSIS — E538 Deficiency of other specified B group vitamins: Secondary | ICD-10-CM | POA: Diagnosis not present

## 2019-10-12 MED ORDER — CYANOCOBALAMIN 1000 MCG/ML IJ SOLN
1000.0000 ug | Freq: Once | INTRAMUSCULAR | Status: AC
  Administered 2019-10-12: 1000 ug via INTRAMUSCULAR

## 2019-10-12 NOTE — Progress Notes (Signed)
Per orders of Dr. Banks, injection of B12 given by Breanah Faddis. Patient tolerated injection well.   

## 2019-10-18 ENCOUNTER — Other Ambulatory Visit: Payer: Self-pay

## 2019-10-19 ENCOUNTER — Ambulatory Visit: Payer: 59

## 2019-10-23 ENCOUNTER — Ambulatory Visit: Payer: 59

## 2019-10-31 ENCOUNTER — Telehealth: Payer: Self-pay | Admitting: Internal Medicine

## 2019-10-31 NOTE — Telephone Encounter (Signed)
I sent the OV notes to the number given and PFT to [employer]. Nothing further is needed.

## 2019-11-01 ENCOUNTER — Other Ambulatory Visit: Payer: Self-pay

## 2019-11-02 ENCOUNTER — Ambulatory Visit: Payer: 59

## 2019-11-02 ENCOUNTER — Encounter: Payer: Self-pay | Admitting: Family Medicine

## 2019-11-02 ENCOUNTER — Other Ambulatory Visit: Payer: Self-pay | Admitting: Family Medicine

## 2019-11-02 ENCOUNTER — Ambulatory Visit (INDEPENDENT_AMBULATORY_CARE_PROVIDER_SITE_OTHER): Payer: 59

## 2019-11-02 ENCOUNTER — Ambulatory Visit (INDEPENDENT_AMBULATORY_CARE_PROVIDER_SITE_OTHER): Payer: 59 | Admitting: Family Medicine

## 2019-11-02 VITALS — BP 110/78 | HR 88 | Temp 98.3°F | Wt 179.0 lb

## 2019-11-02 DIAGNOSIS — M25571 Pain in right ankle and joints of right foot: Secondary | ICD-10-CM

## 2019-11-02 DIAGNOSIS — G8929 Other chronic pain: Secondary | ICD-10-CM | POA: Diagnosis not present

## 2019-11-02 DIAGNOSIS — Z8616 Personal history of COVID-19: Secondary | ICD-10-CM

## 2019-11-02 DIAGNOSIS — G4709 Other insomnia: Secondary | ICD-10-CM | POA: Diagnosis not present

## 2019-11-02 NOTE — Patient Instructions (Signed)
Ankle Pain The ankle joint holds your body weight and allows you to move around. Ankle pain can occur on either side or the back of one ankle or both ankles. Ankle pain may be sharp and burning or dull and aching. There may be tenderness, stiffness, redness, or warmth around the ankle. Many things can cause ankle pain, including an injury to the area and overuse of the ankle. Follow these instructions at home: Activity  Rest your ankle as told by your health care provider. Avoid any activities that cause ankle pain.  Do not use the injured limb to support your body weight until your health care provider says that you can. Use crutches as told by your health care provider.  Do exercises as told by your health care provider.  Ask your health care provider when it is safe to drive if you have a brace on your ankle. If you have a brace:  Wear the brace as told by your health care provider. Remove it only as told by your health care provider.  Loosen the brace if your toes tingle, become numb, or turn cold and blue.  Keep the brace clean.  If the brace is not waterproof: ? Do not let it get wet. ? Cover it with a watertight covering when you take a bath or shower. If you were given an elastic bandage:   Remove it when you take a bath or a shower.  Try not to move your ankle very much, but wiggle your toes from time to time. This helps to prevent swelling.  Adjust the bandage to make it more comfortable if it feels too tight.  Loosen the bandage if you have numbness or tingling in your foot or if your foot turns cold and blue. Managing pain, stiffness, and swelling   If directed, put ice on the painful area. ? If you have a removable brace or elastic bandage, remove it as told by your health care provider. ? Put ice in a plastic bag. ? Place a towel between your skin and the bag. ? Leave the ice on for 20 minutes, 2-3 times a day.  Move your toes often to avoid stiffness and to  lessen swelling.  Raise (elevate) your ankle above the level of your heart while you are sitting or lying down. General instructions  Record information about your pain. Writing down the following may be helpful for you and your health care provider: ? How often you have ankle pain. ? Where the pain is located. ? What the pain feels like.  If treatment involves wearing a prescribed shoe or insole, make sure you wear it correctly and for as long as told by your health care provider.  Take over-the-counter and prescription medicines only as told by your health care provider.  Keep all follow-up visits as told by your health care provider. This is important. Contact a health care provider if:  Your pain gets worse.  Your pain is not relieved with medicines.  You have a fever or chills.  You are having more trouble with walking.  You have new symptoms. Get help right away if:  Your foot, leg, toes, or ankle: ? Tingles or becomes numb. ? Becomes swollen. ? Turns pale or blue. Summary  Ankle pain can occur on either side or the back of one ankle or both ankles.  Ankle pain may be sharp and burning or dull and aching.  Rest your ankle as told by your health care provider.   If told, apply ice to the area.  Take over-the-counter and prescription medicines only as told by your health care provider. This information is not intended to replace advice given to you by your health care provider. Make sure you discuss any questions you have with your health care provider. Document Revised: 11/01/2018 Document Reviewed: 01/19/2018 Elsevier Patient Education  2020 [employer] Inc[employer]  Insomnia Insomnia is a sleep disorder that makes it difficult to fall asleep or stay asleep. Insomnia can cause fatigue, low energy, difficulty concentrating, mood swings, and poor performance at work or school. There are three different ways to classify insomnia:  Difficulty falling asleep.  Difficulty  staying asleep.  Waking up too early in the morning. Any type of insomnia can be long-term (chronic) or short-term (acute). Both are common. Short-term insomnia usually lasts for three months or less. Chronic insomnia occurs at least three times a week for longer than three months. What are the causes? Insomnia may be caused by another condition, situation, or substance, such as:  Anxiety.  Certain medicines.  Gastroesophageal reflux disease (GERD) or other gastrointestinal conditions.  Asthma or other breathing conditions.  Restless legs syndrome, sleep apnea, or other sleep disorders.  Chronic pain.  Menopause.  Stroke.  Abuse of alcohol, tobacco, or illegal drugs.  Mental health conditions, such as depression.  Caffeine.  Neurological disorders, such as Alzheimer's disease.  An overactive thyroid (hyperthyroidism). Sometimes, the cause of insomnia may not be known. What increases the risk? Risk factors for insomnia include:  Gender. Women are affected more often than men.  Age. Insomnia is more common as you get older.  Stress.  Lack of exercise.  Irregular work schedule or working night shifts.  Traveling between different time zones.  Certain medical and mental health conditions. What are the signs or symptoms? If you have insomnia, the main symptom is having trouble falling asleep or having trouble staying asleep. This may lead to other symptoms, such as:  Feeling fatigued or having low energy.  Feeling nervous about going to sleep.  Not feeling rested in the morning.  Having trouble concentrating.  Feeling irritable, anxious, or depressed. How is this diagnosed? This condition may be diagnosed based on:  Your symptoms and medical history. Your health care provider may ask about: ? Your sleep habits. ? Any medical conditions you have. ? Your mental health.  A physical exam. How is this treated? Treatment for insomnia depends on the cause.  Treatment may focus on treating an underlying condition that is causing insomnia. Treatment may also include:  Medicines to help you sleep.  Counseling or therapy.  Lifestyle adjustments to help you sleep better. Follow these instructions at home: Eating and drinking   Limit or avoid alcohol, caffeinated beverages, and cigarettes, especially close to bedtime. These can disrupt your sleep.  Do not eat a large meal or eat spicy foods right before bedtime. This can lead to digestive discomfort that can make it hard for you to sleep. Sleep habits   Keep a sleep diary to help you and your health care provider figure out what could be causing your insomnia. Write down: ? When you sleep. ? When you wake up during the night. ? How well you sleep. ? How rested you feel the next day. ? Any side effects of medicines you are taking. ? What you eat and drink.  Make your bedroom a dark, comfortable place where it is easy to fall asleep. ? Put up shades or blackout curtains to  block light from outside. ? Use a white noise machine to block noise. ? Keep the temperature cool.  Limit screen use before bedtime. This includes: ? Watching TV. ? Using your smartphone, tablet, or computer.  Stick to a routine that includes going to bed and waking up at the same times every day and night. This can help you fall asleep faster. Consider making a quiet activity, such as reading, part of your nighttime routine.  Try to avoid taking naps during the day so that you sleep better at night.  Get out of bed if you are still awake after 15 minutes of trying to sleep. Keep the lights down, but try reading or doing a quiet activity. When you feel sleepy, go back to bed. General instructions  Take over-the-counter and prescription medicines only as told by your health care provider.  Exercise regularly, as told by your health care provider. Avoid exercise starting several hours before bedtime.  Use relaxation  techniques to manage stress. Ask your health care provider to suggest some techniques that may work well for you. These may include: ? Breathing exercises. ? Routines to release muscle tension. ? Visualizing peaceful scenes.  Make sure that you drive carefully. Avoid driving if you feel very sleepy.  Keep all follow-up visits as told by your health care provider. This is important. Contact a health care provider if:  You are tired throughout the day.  You have trouble in your daily routine due to sleepiness.  You continue to have sleep problems, or your sleep problems get worse. Get help right away if:  You have serious thoughts about hurting yourself or someone else. If you ever feel like you may hurt yourself or others, or have thoughts about taking your own life, get help right away. You can go to your nearest emergency department or call:  Your local emergency services (911 in the [place]  A suicide crisis helpline, such as the [facility] at 803-331-3339. This is open 24 hours a day. Summary  Insomnia is a sleep disorder that makes it difficult to fall asleep or stay asleep.  Insomnia can be long-term (chronic) or short-term (acute).  Treatment for insomnia depends on the cause. Treatment may focus on treating an underlying condition that is causing insomnia.  Keep a sleep diary to help you and your health care provider figure out what could be causing your insomnia. This information is not intended to replace advice given to you by your health care provider. Make sure you discuss any questions you have with your health care provider. Document Revised: 06/25/2017 Document Reviewed: 04/22/2017 Elsevier Patient Education  2020 [employer].  Vitamin D Deficiency Vitamin D deficiency is when your body does not have enough vitamin D. Vitamin D is important to your body for many reasons:  It helps the body absorb two important minerals--calcium and  phosphorus.  It plays a role in bone health.  It may help to prevent some diseases, such as diabetes and multiple sclerosis.  It plays a role in muscle function, including heart function. If vitamin D deficiency is severe, it can cause a condition in which your bones become soft. In adults, this condition is called osteomalacia. In children, this condition is called rickets. What are the causes? This condition may be caused by:  Not eating enough foods that contain vitamin D.  Not getting enough natural sun exposure.  Having certain digestive system diseases that make it difficult for your body to absorb  vitamin D. These diseases include Crohn's disease, chronic pancreatitis, and cystic fibrosis.  Having a surgery in which a part of the stomach or a part of the small intestine is removed.  Having chronic kidney disease or liver disease. What increases the risk? You are more likely to develop this condition if you:  Are older.  Do not spend much time outdoors.  Live in a long-term care facility.  Have had broken bones.  Have weak or thin bones (osteoporosis).  Have a disease or condition that changes how the body absorbs vitamin D.  Have dark skin.  Take certain medicines, such as steroid medicines or certain seizure medicines.  Are overweight or obese. What are the signs or symptoms? In mild cases of vitamin D deficiency, there may not be any symptoms. If the condition is severe, symptoms may include:  Bone pain.  Muscle pain.  Falling often.  Broken bones caused by a minor injury. How is this diagnosed? This condition may be diagnosed with blood tests. Imaging tests such as X-rays may also be done to look for changes in the bone. How is this treated? Treatment for this condition may depend on what caused the condition. Treatment options include:  Taking vitamin D supplements. Your health care provider will suggest what dose is best for you.  Taking a calcium  supplement. Your health care provider will suggest what dose is best for you. Follow these instructions at home: Eating and drinking   Eat foods that contain vitamin D. Choices include: ? Fortified dairy products, cereals, or juices. Fortified means that vitamin D has been added to the food. Check the label on the package to see if the food is fortified. ? Fatty fish, such as salmon or trout. ? Eggs. ? Oysters. ? Mushrooms. The items listed above may not be a complete list of recommended foods and beverages. Contact a dietitian for more information. General instructions  Take medicines and supplements only as told by your health care provider.  Get regular, safe exposure to natural sunlight.  Do not use a tanning bed.  Maintain a healthy weight. Lose weight if needed.  Keep all follow-up visits as told by your health care provider. This is important. How is this prevented? You can get vitamin D by:  Eating foods that naturally contain vitamin D.  Eating or drinking products that have been fortified with vitamin D, such as cereals, juices, and dairy products (including milk).  Taking a vitamin D supplement or a multivitamin supplement that contains vitamin D.  Being in the sun. Your body naturally makes vitamin D when your skin is exposed to sunlight. Your body changes the sunlight into a form of the vitamin that it can use. Contact a health care provider if:  Your symptoms do not go away.  You feel nauseous or you vomit.  You have fewer bowel movements than usual or are constipated. Summary  Vitamin D deficiency is when your body does not have enough vitamin D.  Vitamin D is important to your body for good bone health and muscle function, and it may help prevent some diseases.  Vitamin D deficiency is primarily treated through supplementation. Your health care provider will suggest what dose is best for you.  You can get vitamin D by eating foods that contain vitamin  D, by being in the sun, and by taking a vitamin D supplement or a multivitamin supplement that contains vitamin D. This information is not intended to replace advice given to  you by your health care provider. Make sure you discuss any questions you have with your health care provider. Document Revised: 03/21/2018 Document Reviewed: 03/21/2018 Elsevier Patient Education  2020 Else[employer] Inc.  Vitamin B12 Deficiency Vitamin B12 deficiency occurs when the body does not have enough vitamin B12, which is an important vitamin. The body needs this vitamin:  To make red blood cells.  To make DNA. This is the genetic material inside cells.  To help the nerves work properly so they can carry messages from the brain to the body. Vitamin B12 deficiency can cause various health problems, such as a low red blood cell count (anemia) or nerve damage. What are the causes? This condition may be caused by:  Not eating enough foods that contain vitamin B12.  Not having enough stomach acid and digestive fluids to properly absorb vitamin B12 from the food that you eat.  Certain digestive system diseases that make it hard to absorb vitamin B12. These diseases include Crohn's disease, chronic pancreatitis, and cystic fibrosis.  A condition in which the body does not make enough of a protein (intrinsic factor), resulting in too few red blood cells (pernicious anemia).  Having a surgery in which part of the stomach or small intestine is removed.  Taking certain medicines that make it hard for the body to absorb vitamin B12. These medicines include: ? Heartburn medicines (antacids and proton pump inhibitors). ? Certain antibiotic medicines. ? Some medicines that are used to treat diabetes, tuberculosis, gout, or high cholesterol. What increases the risk? The following factors may make you more likely to develop a B12 deficiency:  Being older than age 39.  Eating a vegetarian or vegan diet, especially while  you are pregnant.  Eating a poor diet while you are pregnant.  Taking certain medicines.  Having alcoholism. What are the signs or symptoms? In some cases, there are no symptoms of this condition. If the condition leads to anemia or nerve damage, various symptoms can occur, such as:  Weakness.  Fatigue.  Loss of appetite.  Weight loss.  Numbness or tingling in your hands and feet.  Redness and burning of the tongue.  Confusion or memory problems.  Depression.  Sensory problems, such as color blindness, ringing in the ears, or loss of taste.  Diarrhea or constipation.  Trouble walking. If anemia is severe, symptoms can include:  Shortness of breath.  Dizziness.  Rapid heart rate (tachycardia). How is this diagnosed? This condition may be diagnosed with a blood test to measure the level of vitamin B12 in your blood. You may also have other tests, including:  A group of tests that measure certain characteristics of blood cells (complete blood count, CBC).  A blood test to measure intrinsic factor.  A procedure where a thin tube with a camera on the end is used to look into your stomach or intestines (endoscopy). Other tests may be needed to discover the cause of B12 deficiency. How is this treated? Treatment for this condition depends on the cause. This condition may be treated by:  Changing your eating and drinking habits, such as: ? Eating more foods that contain vitamin B12. ? Drinking less alcohol or no alcohol.  Getting vitamin B12 injections.  Taking vitamin B12 supplements. Your health care provider will tell you which dosage is best for you. Follow these instructions at home: Eating and drinking   Eat lots of healthy foods that contain vitamin B12, including: ? Meats and poultry. This includes beef, pork, chicken,  [place], and organ meats, such as liver. ? Seafood. This includes clams, rainbow trout, salmon, tuna, and haddock. ? Eggs. ? Cereal and  dairy products that are fortified. This means that vitamin B12 has been added to the food. Check the label on the package to see if the food is fortified. The items listed above may not be a complete list of recommended foods and beverages. Contact a dietitian for more information. General instructions  Get any injections that are prescribed by your health care provider.  Take supplements only as told by your health care provider. Follow the directions carefully.  Do not drink alcohol if your health care provider tells you not to. In some cases, you may only be asked to limit alcohol use.  Keep all follow-up visits as told by your health care provider. This is important. Contact a health care provider if:  Your symptoms come back. Get help right away if you:  Develop shortness of breath.  Have a rapid heart rate.  Have chest pain.  Become dizzy or lose consciousness. Summary  Vitamin B12 deficiency occurs when the body does not have enough vitamin B12.  The main causes of vitamin B12 deficiency include dietary deficiency, digestive diseases, pernicious anemia, and having a surgery in which part of the stomach or small intestine is removed.  In some cases, there are no symptoms of this condition. If the condition leads to anemia or nerve damage, various symptoms can occur, such as weakness, shortness of breath, and numbness.  Treatment may include getting vitamin B12 injections or taking vitamin B12 supplements. Eat lots of healthy foods that contain vitamin B12. This information is not intended to replace advice given to you by your health care provider. Make sure you discuss any questions you have with your health care provider. Document Revised: 12/30/2018 Document Reviewed: 03/22/2018 Elsevier Patient Education  2020 [employer]

## 2019-11-02 NOTE — Progress Notes (Signed)
Subjective:    Patient ID: Lorance Pickeral, male    DOB: 04-Oct-1990, 29 y.o.   MRN: [id number]  No chief complaint on file.   HPI Patient was seen today for f/u.  States gradually feeling better after COVID-19 infection on 02/06/19.  Trying to eat better, walk, and go to bed earlier.  Pt inquires about possible return to work date.  Pt also notes continued R ankle pain x 3 months.  Denies erythema, edema.  Wearing supportive tennis shoes that are a few yrs old.  Pt states ramelton is helping with sleep, but is $180/month.  Past Medical History:  Diagnosis Date  . Childhood asthma   . COVID-19     No Known Allergies  ROS General: Denies fever, chills, night sweats, changes in weight, changes in appetite +mild fatigue HEENT: Denies headaches, ear pain, changes in vision, rhinorrhea, sore throat CV: Denies CP, palpitations, SOB, orthopnea  +improving SOB Pulm: Denies cough, wheezing  +SOB GI: Denies abdominal pain, nausea, vomiting, diarrhea, constipation GU: Denies dysuria, hematuria, frequency Msk: Denies muscle cramps, joint pains  + R ankle pain Neuro: Denies weakness, numbness, tingling Skin: Denies rashes, bruising Psych: Denies depression, anxiety, hallucinations  +insomnia    Objective:    Blood pressure 110/78, pulse 88, temperature 98.3 F (36.8 C), temperature source Temporal, weight 179 lb (81.2 kg), SpO2 98 %.  Gen. Pleasant, well-nourished, in no distress, normal affect   HEENT: [place]/AT, face symmetric, no scleral icterus, PERRLA, EOMI, nares patent without drainages Lungs: no accessory muscle use Cardiovascular: RRR, no m/r/g, no peripheral edema Abdomen: BS present, soft, NT/ND, no hepatosplenomegaly. Musculoskeletal: R foot/ankle without deformity.  TTP of cuboid and lateral cuneiform bones of R foot.  Pain with inversion and eversion of R foot.  No instability of joint.  DP and PT pulses 2+ b/l.  No deformities, no cyanosis or clubbing, normal tone.  L foot  normal. Neuro:  A&Ox3, CN II-XII intact, normal gait Skin:  Warm, no lesions/ rash   Wt Readings from Last 3 Encounters:  11/02/19 179 lb (81.2 kg)  10/04/19 185 lb (83.9 kg)  09/29/19 180 lb 3.2 oz (81.7 kg)    Lab Results  Component Value Date   WBC 4.8 10/04/2019   HGB 12.3 (L) 10/04/2019   HCT 37.0 (L) 10/04/2019   PLT 302.0 10/04/2019   GLUCOSE 92 10/04/2019   CHOL 125 10/04/2019   TRIG 119.0 10/04/2019   HDL 26.80 (L) 10/04/2019   LDLCALC 75 10/04/2019   ALT 90 (H) 02/22/2019   AST 74 (H) 02/22/2019   NA 134 (L) 10/04/2019   K 3.9 10/04/2019   CL 101 10/04/2019   CREATININE 0.90 10/04/2019   BUN 8 10/04/2019   CO2 30 10/04/2019   TSH 2.73 10/04/2019   HGBA1C 5.1 10/04/2019    Assessment/Plan:  Personal history of covid-19 -long haul symptoms improving -plan to return to work this month.  Will likely need reduced schedule to start. -will complete forms if needed.  Chronic pain of right ankle  -likely 2/2 sprain, however must consider arthritis or fx given duration of symptoms -will obtain imgaing -continue supportive care: NSAIDs or tylenol, heat, exercises/stretching - Plan: [facility] Complete Right  Other insomnia -Continue ramelteon -Advised to check cost other pharmacies. -Advised to check good Rx or online for other discounts. -Continue sleep hygiene  F/u 1 month  Abbe Amsterdam, MD

## 2019-11-03 ENCOUNTER — Other Ambulatory Visit: Payer: Self-pay

## 2019-11-03 DIAGNOSIS — G8929 Other chronic pain: Secondary | ICD-10-CM

## 2019-11-03 DIAGNOSIS — M25571 Pain in right ankle and joints of right foot: Secondary | ICD-10-CM

## 2019-11-05 ENCOUNTER — Encounter: Payer: Self-pay | Admitting: Family Medicine

## 2019-11-15 ENCOUNTER — Encounter: Payer: Self-pay | Admitting: Family Medicine

## 2019-11-15 ENCOUNTER — Ambulatory Visit (INDEPENDENT_AMBULATORY_CARE_PROVIDER_SITE_OTHER): Payer: 59

## 2019-11-15 ENCOUNTER — Other Ambulatory Visit: Payer: Self-pay

## 2019-11-15 DIAGNOSIS — E538 Deficiency of other specified B group vitamins: Secondary | ICD-10-CM

## 2019-11-15 MED ORDER — CYANOCOBALAMIN 1000 MCG/ML IJ SOLN
1000.0000 ug | Freq: Once | INTRAMUSCULAR | Status: AC
  Administered 2019-11-15: 1000 ug via INTRAMUSCULAR

## 2019-11-15 NOTE — Progress Notes (Signed)
Per orders of Dr. Banks, injection of Cyanocobalamin 1,000 mcg/mL given by Prue Lingenfelter N Sallyann Kinnaird. Patient tolerated injection well.  

## 2019-11-24 ENCOUNTER — Other Ambulatory Visit: Payer: Self-pay

## 2019-11-24 ENCOUNTER — Telehealth: Payer: Self-pay | Admitting: Family Medicine

## 2019-11-24 DIAGNOSIS — G4709 Other insomnia: Secondary | ICD-10-CM

## 2019-11-24 MED ORDER — RAMELTEON 8 MG PO TABS: 8.0000 mg | ORAL_TABLET | Freq: Every day | ORAL | 0 refills | Status: DC

## 2019-11-24 NOTE — Telephone Encounter (Signed)
Patient is requesting a call back in reference to her prescription.

## 2019-11-24 NOTE — Telephone Encounter (Signed)
Rx sent to pt pharmacy as requested 

## 2019-11-27 ENCOUNTER — Ambulatory Visit (INDEPENDENT_AMBULATORY_CARE_PROVIDER_SITE_OTHER): Payer: 59 | Admitting: Podiatry

## 2019-11-27 ENCOUNTER — Encounter: Payer: Self-pay | Admitting: Podiatry

## 2019-11-27 ENCOUNTER — Other Ambulatory Visit: Payer: Self-pay

## 2019-11-27 ENCOUNTER — Ambulatory Visit (INDEPENDENT_AMBULATORY_CARE_PROVIDER_SITE_OTHER): Payer: 59

## 2019-11-27 VITALS — Temp 96.4°F

## 2019-11-27 DIAGNOSIS — M779 Enthesopathy, unspecified: Secondary | ICD-10-CM | POA: Diagnosis not present

## 2019-11-27 DIAGNOSIS — M76829 Posterior tibial tendinitis, unspecified leg: Secondary | ICD-10-CM

## 2019-11-27 DIAGNOSIS — G8929 Other chronic pain: Secondary | ICD-10-CM

## 2019-11-27 DIAGNOSIS — M25571 Pain in right ankle and joints of right foot: Secondary | ICD-10-CM

## 2019-11-27 MED ORDER — METHYLPREDNISOLONE 4 MG PO TBPK: ORAL_TABLET | ORAL | 0 refills | Status: DC

## 2019-11-27 NOTE — Patient Instructions (Signed)
For instructions on how to put on your Tri-Lock Ankle Brace, please visit [email or web address]   Posterior Tibial Tendon Tear Rehab Ask your health care provider which exercises are safe for you. Do exercises exactly as told by your health care provider and adjust them as directed. It is normal to feel mild stretching, pulling, tightness, or discomfort as you do these exercises. Stop right away if you feel sudden pain or your pain gets worse. Do not begin these exercises until told by your health care provider. Stretching and range-of-motion exercises These exercises warm up your muscles and joints and improve the movement and flexibility of your ankle. These exercises also help to relieve pain, numbness, and tingling. Gastroc stretch  1. Sit on the floor with your left / right leg extended. 2. Loop a belt or towel around ball of your left / right foot. The ball of your foot is on the walking surface, right under your toes. 3. Keep your left / right ankle and foot relaxed and keep your knee straight while you use the belt or towel to pull your foot and ankle toward you. You should feel a gentle stretch behind your calf or knee (gastrocnemius). 4. Hold this position for __________ seconds. Repeat __________ times. Complete this exercise __________ times a day. Active ankle dorsiflexion and plantar flexion  1. Sit with your left / right knee straight or bent. Do not rest your foot on anything. 2. Flex your left / right ankle to tilt the top of your foot toward your shin (dorsiflexion). 3. Hold this position for __________ seconds. 4. Point your toes downward to tilt the top of your foot away from your shin (plantar flexion). 5. Hold this position for __________ seconds. Repeat __________ times with your knee straight and __________ times with your knee bent. Complete this exercise __________ times a day. Passive ankle plantar flexion  1. Sit with your left / right leg crossed over your  opposite knee. 2. With your left / right hand, pull the front of your foot and toes toward you (plantar flexion). You should feel a gentle stretch on the top of your foot and ankle. 3. Hold this position for __________ seconds. Repeat __________ times. Complete this exercise __________ times a day. Passive ankle eversion  1. Sit with your left / right ankle crossed over your opposite knee. 2. With your left / right hand, hold your foot so that your thumb is on the top of your foot and your fingers are on the bottom of your foot. 3. Gently push and twist your ankle downward (eversion) so the smallest toes rise slightly toward the ceiling. You should feel a gentle stretch on the inside of your ankle. 4. Hold this stretch for __________ seconds. Repeat __________ times. Complete this exercise __________ times a day. Passive ankle inversion  1. Sit with your left / right ankle crossed over your opposite knee. 2. With your left / right hand, hold your foot so that your thumb is on the bottom of your foot and your fingers are across the top of your foot. 3. Gently pull and twist your foot so the smallest toe comes toward you (inversion). You should feel a gentle stretch on the outside of your ankle. 4. Hold the stretch for __________ seconds. Repeat __________ times. Complete this exercise __________ times a day. Ankle alphabet  1. Sit with your left / right leg supported at the lower leg. ? Do not rest your foot on anything. ? Make sure  your foot has room to move freely. 2. Think of your left / right foot as a paintbrush, and move your foot to trace each letter of the alphabet in the air. Keep your hip and knee still while you trace. 3. Trace every letter of the alphabet. Repeat __________ times. Complete this exercise __________ times a day. Strengthening exercises These exercises build strength and endurance in your lower leg. Endurance is the ability to use your muscles for a long time, even  after they get tired. Dorsiflexion  1. Secure a rubber exercise band or tube to an object that will not move if it is pulled on, such as a table leg. 2. Secure the other end of the band around your left / right foot. 3. Sit on the floor, facing the object with your left / right leg extended. The band or tube should be slightly tense when your foot is relaxed. 4. Slowly flex your left / right ankle and toes to bring your foot toward you (dorsiflexion). 5. Hold this position for __________ seconds. 6. Let the band or tube slowly pull your foot back to the starting position. Repeat __________ times. Complete this exercise __________ times a day. Plantar flexion while seated  1. Sit on the floor with your left / right leg extended. 2. Loop a rubber exercise band or tube around the ball of your left / right foot. The ball of your foot is on the walking surface, right under your toes. The band or tube should be slightly tense when your foot is relaxed. 3. Slowly point your toes downward, pushing them away from you (plantar flexion). 4. Hold this position for __________ seconds. 5. Let the band or tube slowly pull your foot back to the starting position. Repeat __________ times. Complete this exercise __________ times a day. Towel curls  1. Sit in a chair on a non-carpeted surface, and put your feet on the floor. 2. Place a towel in front of your feet. If told by your health care provider, add __________ to the end of the towel. 3. Keeping your heel on the floor, put your left / right foot on the towel. 4. Pull the towel toward you by grabbing the towel with your toes and curling them under. Keep your heel on the floor. Repeat __________ times. Complete this exercise __________ times a day. This information is not intended to replace advice given to you by your health care provider. Make sure you discuss any questions you have with your health care provider. Document Revised: 11/04/2018 Document  Reviewed: 08/31/2018 Elsevier Patient Education  2020 [employer].

## 2019-11-29 NOTE — Progress Notes (Signed)
Subjective:   Patient ID: Tim Delgado, male   DOB: 29 y.o.   MRN: [id number]   HPI 29 year old male presents the office with concerns of right ankle pain which hurts when he walks he is on his feet a lot.  He says at times it is difficult to bend his ankle.  Hurts with pressure.  Describes throbbing sensation.  No recent injury or trauma.  He does notice swelling.  Also hurts with shoes.  No popping or snapping sensations.  No weakness or falls.  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   Review of Systems  All other systems reviewed and are negative.  Past Medical History:  Diagnosis Date  . Childhood asthma   . COVID-19     History reviewed. No pertinent surgical history.   Current Outpatient Medications:  .  albuterol (VENTOLIN HFA) 108 (90 Base) MCG/ACT inhaler, Inhale 2 puffs into the lungs every 6 (six) hours as needed for wheezing or shortness of breath., Disp: 6.7 g, Rfl: 5 .  Ascorbic Acid (VITAMIN C) 100 MG tablet, Take 100 mg by mouth daily., Disp: , Rfl:  .  aspirin 325 MG tablet, Take 325 mg by mouth 2 (two) times a week., Disp: , Rfl:  .  b complex vitamins tablet, Take 1 tablet by mouth daily., Disp: , Rfl:  .  Budesonide (PULMICORT FLEXHALER) 90 MCG/ACT inhaler, Inhale 1 puff into the lungs 2 (two) times daily., Disp: 1 each, Rfl: 5 .  Echinacea 125 MG CAPS, Take 1 capsule by mouth daily., Disp: , Rfl:  .  fluticasone (FLOVENT HFA) 220 MCG/ACT inhaler, Inhale 1 puff into the lungs 2 (two) times daily., Disp: 1 Inhaler, Rfl: 5 .  Ginger 500 MG CAPS, Take 1 capsule by mouth daily., Disp: , Rfl:  .  ibuprofen (ADVIL) 600 MG tablet, Take 1 tablet (600 mg total) by mouth every 8 (eight) hours as needed., Disp: 30 tablet, Rfl: 0 .  methylPREDNISolone (MEDROL DOSEPAK) 4 MG TBPK tablet, Take as directed, Disp: 21 tablet, Rfl: 0 .  Multiple Vitamin (MULTIVITAMIN WITH MINERALS) TABS tablet, Take 1 tablet by mouth daily., Disp: , Rfl:  .  OVER THE COUNTER MEDICATION, Take 1 capsule by  mouth daily. [place]cad[place] supplement otc, Disp: , Rfl:  .  ramelteon (ROZEREM) 8 MG tablet, Take 1 tablet (8 mg total) by mouth at bedtime., Disp: 30 tablet, Rfl: 0 .  Turmeric 400 MG CAPS, Take 1 capsule by mouth 2 (two) times daily at 10 AM and 5 PM., Disp: , Rfl:  .  Vitamin D, Ergocalciferol, (DRISDOL) 1.25 MG (50000 UNIT) CAPS capsule, Take 1 capsule (50,000 Units total) by mouth every 7 (seven) days., Disp: 12 capsule, Rfl: 0 .  zinc gluconate 50 MG tablet, Take 50 mg by mouth daily., Disp: , Rfl:   No Known Allergies         Objective:  Physical Exam  General: AAO x3, NAD  Dermatological: Skin is warm, dry and supple bilateral. Nails x 10 are well manicured; remaining integument appears unremarkable at this time. There are no open sores, no preulcerative lesions, no rash or signs of infection present.  Vascular: Dorsalis Pedis artery and Posterior Tibial artery pedal pulses are 2/4 bilateral with immedate capillary fill time. Pedal hair growth present. No varicosities and no lower extremity edema present bilateral. There is no pain with calf compression, swelling, warmth, erythema.   Neruologic: Grossly intact via light touch bilateral.   Musculoskeletal: There is tenderness to  palpation directly on the course the posterior tibial tendon posterior and inferior to the medial malleolus on insertion of the navicular tuberosity.  This is where there is some mild edema present there is no erythema or warmth.  He is able to do a double heel rise as well as a single heel rise in the left foot cannot do a single heel rise on the right.  Mild tenderness in the anterior ankle joint line.  There is no area of pinpoint tenderness.  Flatfoot is evident.  Muscular strength 5/5 in all groups tested bilateral.  Gait: Unassisted, Nonantalgic.       Assessment:   Right posterior tibial tendon dysfunction, flatfoot deformity   Plan:   -Treatment options discussed including all alternatives,  risks, and complications -Etiology of symptoms were discussed -X-rays were obtained and reviewed with the patient.  There is no evidence of acute fracture or stress fracture identified today. -I do think most of his symptoms and ankle pain is coming from posterior tibial tendon dysfunction.  Today I recommended immobilization.  Tri-Lock ankle brace was dispensed.  Ice elevation.  Medrol Dosepak prescribed.  As he starts to feel better discussed exercises to help rehab the posterior tibial tendon.  I will see him back next appointment likely measured for orthotics.  If no improvement MRI.  We will check orthotic benefit coverage.  Return in about 3 weeks (around 12/18/2019).  Vivi Barrack DPM

## 2019-12-05 ENCOUNTER — Encounter: Payer: Self-pay | Admitting: Podiatry

## 2019-12-05 ENCOUNTER — Encounter: Payer: Self-pay | Admitting: Family Medicine

## 2019-12-07 ENCOUNTER — Ambulatory Visit (INDEPENDENT_AMBULATORY_CARE_PROVIDER_SITE_OTHER): Payer: 59

## 2019-12-07 ENCOUNTER — Telehealth: Payer: Self-pay | Admitting: *Deleted

## 2019-12-07 ENCOUNTER — Other Ambulatory Visit: Payer: Self-pay

## 2019-12-07 DIAGNOSIS — E538 Deficiency of other specified B group vitamins: Secondary | ICD-10-CM

## 2019-12-07 DIAGNOSIS — M779 Enthesopathy, unspecified: Secondary | ICD-10-CM

## 2019-12-07 DIAGNOSIS — M76829 Posterior tibial tendinitis, unspecified leg: Secondary | ICD-10-CM

## 2019-12-07 DIAGNOSIS — M25571 Pain in right ankle and joints of right foot: Secondary | ICD-10-CM

## 2019-12-07 MED ORDER — CYANOCOBALAMIN 1000 MCG/ML IJ SOLN
1000.0000 ug | Freq: Once | INTRAMUSCULAR | Status: AC
  Administered 2019-12-07: 1000 ug via INTRAMUSCULAR

## 2019-12-07 NOTE — Progress Notes (Signed)
Per orders of Dr.Banks , injection of B12 given in R deltoid by Sherrin Daisy. Patient tolerated injection well.

## 2019-12-07 NOTE — Telephone Encounter (Signed)
-----   Message from Vivi Barrack, DPM sent at 12/05/2019  6:05 PM EDT ----- Can you please order an MRI of the right ankle to rule out partial tear of the posterior tibial tendon? Thanks.

## 2019-12-07 NOTE — Patient Instructions (Signed)
Health Maintenance Due  Topic Date Due  . HIV Screening  Never done  . TETANUS/TDAP  Never done    No flowsheet data found.

## 2019-12-07 NOTE — Telephone Encounter (Signed)
Faxed orders, clinicals and demographics to [facility] for pre-cert and scheduling. 

## 2019-12-12 ENCOUNTER — Other Ambulatory Visit: Payer: Self-pay | Admitting: Podiatry

## 2019-12-18 ENCOUNTER — Encounter: Payer: Self-pay | Admitting: Podiatry

## 2019-12-18 ENCOUNTER — Other Ambulatory Visit: Payer: Self-pay

## 2019-12-18 ENCOUNTER — Ambulatory Visit (INDEPENDENT_AMBULATORY_CARE_PROVIDER_SITE_OTHER): Payer: 59 | Admitting: Podiatry

## 2019-12-18 DIAGNOSIS — G8929 Other chronic pain: Secondary | ICD-10-CM

## 2019-12-18 DIAGNOSIS — M76829 Posterior tibial tendinitis, unspecified leg: Secondary | ICD-10-CM

## 2019-12-18 DIAGNOSIS — M25571 Pain in right ankle and joints of right foot: Secondary | ICD-10-CM

## 2019-12-18 DIAGNOSIS — M779 Enthesopathy, unspecified: Secondary | ICD-10-CM | POA: Diagnosis not present

## 2019-12-19 ENCOUNTER — Ambulatory Visit
Admission: RE | Admit: 2019-12-19 | Discharge: 2019-12-19 | Disposition: A | Discharge status: Home / Self Care | Payer: 59 | Source: Ambulatory Visit | Attending: Podiatry | Admitting: Podiatry

## 2019-12-19 DIAGNOSIS — G8929 Other chronic pain: Secondary | ICD-10-CM

## 2019-12-19 DIAGNOSIS — M779 Enthesopathy, unspecified: Secondary | ICD-10-CM

## 2019-12-19 DIAGNOSIS — M76829 Posterior tibial tendinitis, unspecified leg: Secondary | ICD-10-CM

## 2019-12-19 NOTE — Progress Notes (Signed)
Subjective: 29 year old male presents the office today for follow-up evaluation of right ankle pain, posterior tibial tendon dysfunction.  He states he is doing the same.  He has noticed improvement in swelling.  He only gets discomfort if he is up and walking.  He is scheduled for MRI tomorrow.  He has not yet returned to work and awaiting the MRI results. Denies any systemic complaints such as fevers, chills, nausea, vomiting. No acute changes since last appointment, and no other complaints at this time.   Objective: AAO x3, NAD-presents with a Tri-Lock ankle brace with foot flops DP/PT pulses palpable bilaterally, CRT less than 3 seconds On physical exam today there is minimal tenderness palpation on the posterior tibial, flexor tendons.  There is no sign of edema there is no erythema or warmth.  Flexor, extensor tendons appear to be intact.  No pain with Achilles tendon.  Minimal discomfort on the insertion of the posterior tibial tendon into the navicular. No pain with calf compression, swelling, warmth, erythema  Assessment: 29 year old male posterior tibial tendon dysfunction, rule out partial tear  Plan: -All treatment options discussed with the patient including all alternatives, risks, complications.  -Seems to be doing better on exam today but he is finished the course of steroids and recently ankle brace.  He is also not yet returned to work.  Awaiting MRI which is scheduled for tomorrow.  Will await the results of this.  If no tear likely further physical therapy, custom orthotics.  If there is immobilization in cam boot and potential surgery if needed. -Patient encouraged to call the office with any questions, concerns, change in symptoms.   Vivi Barrack DPM

## 2019-12-22 ENCOUNTER — Encounter: Payer: Self-pay | Admitting: Podiatry

## 2019-12-28 ENCOUNTER — Telehealth: Payer: Self-pay | Admitting: Podiatry

## 2019-12-28 ENCOUNTER — Other Ambulatory Visit: Payer: Self-pay | Admitting: Podiatry

## 2019-12-28 DIAGNOSIS — M76829 Posterior tibial tendinitis, unspecified leg: Secondary | ICD-10-CM

## 2019-12-28 DIAGNOSIS — M1990 Unspecified osteoarthritis, unspecified site: Secondary | ICD-10-CM

## 2019-12-28 DIAGNOSIS — M779 Enthesopathy, unspecified: Secondary | ICD-10-CM

## 2019-12-28 DIAGNOSIS — G8929 Other chronic pain: Secondary | ICD-10-CM

## 2019-12-28 NOTE — Telephone Encounter (Signed)
Per [facility] orthotics (L3000/L3020) are an exclusion from pts plan. Pt is aware

## 2019-12-29 ENCOUNTER — Encounter: Payer: Self-pay | Admitting: Family Medicine

## 2019-12-29 ENCOUNTER — Telehealth (INDEPENDENT_AMBULATORY_CARE_PROVIDER_SITE_OTHER): Payer: 59 | Admitting: Family Medicine

## 2019-12-29 ENCOUNTER — Encounter: Payer: Self-pay | Admitting: Physician Assistant

## 2019-12-29 DIAGNOSIS — G4709 Other insomnia: Secondary | ICD-10-CM

## 2019-12-29 DIAGNOSIS — Z8719 Personal history of other diseases of the digestive system: Secondary | ICD-10-CM

## 2019-12-29 DIAGNOSIS — K219 Gastro-esophageal reflux disease without esophagitis: Secondary | ICD-10-CM

## 2019-12-29 DIAGNOSIS — Z8616 Personal history of COVID-19: Secondary | ICD-10-CM

## 2019-12-29 DIAGNOSIS — G9331 Postviral fatigue syndrome: Secondary | ICD-10-CM

## 2019-12-29 DIAGNOSIS — G933 Postviral fatigue syndrome: Secondary | ICD-10-CM

## 2019-12-29 DIAGNOSIS — Z8711 Personal history of peptic ulcer disease: Secondary | ICD-10-CM

## 2019-12-29 MED ORDER — RAMELTEON 8 MG PO TABS: 8.0000 mg | ORAL_TABLET | Freq: Every day | ORAL | 2 refills | Status: DC

## 2019-12-29 MED ORDER — SUCRALFATE 1 G PO TABS: 1.0000 g | ORAL_TABLET | Freq: Three times a day (TID) | ORAL | 2 refills | Status: AC

## 2019-12-29 NOTE — Telephone Encounter (Signed)
Delivered orders to [facility].

## 2019-12-29 NOTE — Telephone Encounter (Signed)
-----   Message from Vivi Barrack, DPM sent at 12/28/2019  5:43 PM EDT ----- Val- I have ordered blood work and PT for him. Patient is aware. Thanks.

## 2019-12-29 NOTE — Progress Notes (Signed)
Virtual Visit via Video Note  I connected with Tim Delgado on 12/29/19 at  3:30 PM EDT by a video enabled telemedicine application 2/2 COVID-19 pandemic and verified that I am speaking with the correct person using two identifiers.  Location patient: home Location provider:work or home office Persons participating in the virtual visit: patient, provider  I discussed the limitations of evaluation and management by telemedicine and the availability of in person appointments. The patient expressed understanding and agreed to proceed.   HPI: Pt seen by Podiatry, Dr. Ardelle Anton for R foot pain.  Dx'd with capsulitis.  Undergoing an autoimmune d/o work up.  Patient still recovering from COVID-19 infection.  Initially diagnosed late June 2020.  Breathing is better, but still challenging at night.  Pt is going to bed earlier.  Taking ramelteon for sleep.  Pt never heard back from Post COVID clinic.  Pt having acid reflux.  In the past had a concern for a ulcer.  Now having loose stools and gas.  Denies acid taste in mouth.  Taking leftover carafate which helps.  Pt has never seen GI.  Pt has a head cold.  Thinks he is getting over it.  Has nasal congestion.  Took Mucinex and sinex-D.   ROS: See pertinent positives and negatives per HPI.  Past Medical History:  Diagnosis Date  . Childhood asthma   . COVID-19     No past surgical history on file.  Family History  Problem Relation Age of Onset  . Sarcoidosis Mother   . Asthma Neg Hx      Current Outpatient Medications:  .  albuterol (VENTOLIN HFA) 108 (90 Base) MCG/ACT inhaler, Inhale 2 puffs into the lungs every 6 (six) hours as needed for wheezing or shortness of breath., Disp: 6.7 g, Rfl: 5 .  Ascorbic Acid (VITAMIN C) 100 MG tablet, Take 100 mg by mouth daily., Disp: , Rfl:  .  aspirin 325 MG tablet, Take 325 mg by mouth 2 (two) times a week., Disp: , Rfl:  .  b complex vitamins tablet, Take 1 tablet by mouth daily., Disp: , Rfl:  .   Budesonide (PULMICORT FLEXHALER) 90 MCG/ACT inhaler, Inhale 1 puff into the lungs 2 (two) times daily., Disp: 1 each, Rfl: 5 .  Echinacea 125 MG CAPS, Take 1 capsule by mouth daily., Disp: , Rfl:  .  fluticasone (FLOVENT HFA) 220 MCG/ACT inhaler, Inhale 1 puff into the lungs 2 (two) times daily., Disp: 1 Inhaler, Rfl: 5 .  Ginger 500 MG CAPS, Take 1 capsule by mouth daily., Disp: , Rfl:  .  ibuprofen (ADVIL) 600 MG tablet, Take 1 tablet (600 mg total) by mouth every 8 (eight) hours as needed., Disp: 30 tablet, Rfl: 0 .  methylPREDNISolone (MEDROL DOSEPAK) 4 MG TBPK tablet, Take as directed, Disp: 21 tablet, Rfl: 0 .  Multiple Vitamin (MULTIVITAMIN WITH MINERALS) TABS tablet, Take 1 tablet by mouth daily., Disp: , Rfl:  .  OVER THE COUNTER MEDICATION, Take 1 capsule by mouth daily. [place] supplement otc, Disp: , Rfl:  .  ramelteon (ROZEREM) 8 MG tablet, Take 1 tablet (8 mg total) by mouth at bedtime., Disp: 30 tablet, Rfl: 0 .  Turmeric 400 MG CAPS, Take 1 capsule by mouth 2 (two) times daily at 10 AM and 5 PM., Disp: , Rfl:  .  Vitamin D, Ergocalciferol, (DRISDOL) 1.25 MG (50000 UNIT) CAPS capsule, Take 1 capsule (50,000 Units total) by mouth every 7 (seven) days., Disp: 12 capsule, Rfl: 0 .  zinc gluconate 50 MG tablet, Take 50 mg by mouth daily., Disp: , Rfl:   EXAM:  VITALS per patient if applicable: RR between 93-81 bpm  GENERAL: alert, oriented, appears well and in no acute distress  HEENT: atraumatic, conjunctiva clear, no obvious abnormalities on inspection of external nose and ears  NECK: normal movements of the head and neck  LUNGS: on inspection no signs of respiratory distress, breathing rate appears normal, no obvious gross SOB, gasping or wheezing  CV: no obvious cyanosis  MS: moves all visible extremities without noticeable abnormality  PSYCH/NEURO: pleasant and cooperative, no obvious depression or anxiety, speech and thought processing grossly intact  ASSESSMENT AND  PLAN:  Discussed the following assessment and plan:  History of gastric ulcer  - Plan: sucralfate (CARAFATE) 1 g tablet, Ambulatory referral to Gastroenterology  Other insomnia -Improving - Plan: ramelteon (ROZEREM) 8 MG tablet  History of 2019 novel coronavirus disease (COVID-19) -long haul symptoms -Given note to return to work in 6/09/05/2019 with reduced work schedule.  Patient can advance work schedule as tolerated.  Postviral fatigue syndrome -Likely 2/2 residual effects of Covid-19 infection in January 2020 -appointment with Covid post care clinic on Monday at 9 AM made during video visit with patient.  Gastroesophageal reflux disease, unspecified whether esophagitis present -Avoid foods known to cause symptoms - Plan: Ambulatory referral to Gastroenterology  Follow-up as needed   I discussed the assessment and treatment plan with the patient. The patient was provided an opportunity to ask questions and all were answered. The patient agreed with the plan and demonstrated an understanding of the instructions.   The patient was advised to call back or seek an in-person evaluation if the symptoms worsen or if the condition fails to improve as anticipated.   Billie Ruddy, MD

## 2020-01-01 ENCOUNTER — Ambulatory Visit (INDEPENDENT_AMBULATORY_CARE_PROVIDER_SITE_OTHER): Payer: 59 | Admitting: Nurse Practitioner

## 2020-01-01 ENCOUNTER — Other Ambulatory Visit: Payer: Self-pay

## 2020-01-01 VITALS — BP 112/70 | HR 81 | Temp 97.5°F | Ht 70.0 in | Wt 180.5 lb

## 2020-01-01 DIAGNOSIS — M940 Chondrocostal junction syndrome [Tietze]: Secondary | ICD-10-CM

## 2020-01-01 DIAGNOSIS — Z8616 Personal history of COVID-19: Secondary | ICD-10-CM | POA: Diagnosis not present

## 2020-01-01 DIAGNOSIS — R5383 Other fatigue: Secondary | ICD-10-CM

## 2020-01-01 DIAGNOSIS — R109 Unspecified abdominal pain: Secondary | ICD-10-CM | POA: Insufficient documentation

## 2020-01-01 DIAGNOSIS — R0789 Other chest pain: Secondary | ICD-10-CM

## 2020-01-01 DIAGNOSIS — R0602 Shortness of breath: Secondary | ICD-10-CM | POA: Diagnosis not present

## 2020-01-01 NOTE — Progress Notes (Signed)
@Patient  ID: , male    DOB: October 30, 1990, 29 y.o.   MRN: [id number]  Chief Complaint  Patient presents with  . Post COVID    Tested positive 01/2019. Ongoing fatigue, pain in ribs and SOB    Referring provider: 02/2019, MD  29 year old with history of childhood asthma, GERD. Diagnosed with Covid in June 2020.   HPI  Patient presents today for post Covid care clinic visit.  Patient tested positive for Covid in June 2020.  He was seen in the ED but was not admitted to the hospital at that time.  He states that he is still having ongoing shortness of breath,  right side pain,  and fatigue.    Patient states that he was diagnosed with costochondritis and has had physical therapy which did help right side pain, but did not completely relieve his pain.    Patient has seen pulmonary for shortness of breath and does have a history of childhood asthma.  He has had a PFT.  Patient does have albuterol inhaler to use as needed and Pulmicort.  He does need to schedule a follow-up visit with pulmonary.   Patient has not had a chest x-ray since he was diagnosed with Covid in June.   Patient has recently seen podiatry for some tendon issues and his podiatrist has ordered lab work-up for autoimmune disorder.  Patient has not had these labs drawn yet.    Patient's PCP has also referred him to GI for ongoing issues with reflux.  She prescribed him Carafate which he has not picked up from the pharmacy yet.  He does have an appointment scheduled with GI but has not seen them yet.    Patient did have recent lab work completed by PCP which did show vitamin D deficiency and vitamin B12 deficiency.  His PCP has started him on supplements for this.   Denies f/c/s, n/v/d, hemoptysis, PND, chest pain or edema.     No Known Allergies   There is no immunization history on file for this patient.  Past Medical History:  Diagnosis Date  . Childhood asthma   . COVID-19     Tobacco  History: Social History   Tobacco Use  Smoking Status Never Smoker  Smokeless Tobacco Never Used   Counseling given: Yes   Outpatient Encounter Medications as of 01/01/2020  Medication Sig  . albuterol (VENTOLIN HFA) 108 (90 Base) MCG/ACT inhaler Inhale 2 puffs into the lungs every 6 (six) hours as needed for wheezing or shortness of breath.  . Ascorbic Acid (VITAMIN C) 100 MG tablet Take 100 mg by mouth daily.  03/02/2020 aspirin 325 MG tablet Take 325 mg by mouth 2 (two) times a week.  Marland Kitchen b complex vitamins tablet Take 1 tablet by mouth daily.  . Budesonide (PULMICORT FLEXHALER) 90 MCG/ACT inhaler Inhale 1 puff into the lungs 2 (two) times daily.  . Echinacea 125 MG CAPS Take 1 capsule by mouth daily.  . fluticasone (FLOVENT HFA) 220 MCG/ACT inhaler Inhale 1 puff into the lungs 2 (two) times daily.  . Ginger 500 MG CAPS Take 1 capsule by mouth daily.  Marland Kitchen ibuprofen (ADVIL) 600 MG tablet Take 1 tablet (600 mg total) by mouth every 8 (eight) hours as needed.  . methylPREDNISolone (MEDROL DOSEPAK) 4 MG TBPK tablet Take as directed  . Multiple Vitamin (MULTIVITAMIN WITH MINERALS) TABS tablet Take 1 tablet by mouth daily.  Marland Kitchen OVER THE COUNTER MEDICATION Take 1 capsule by mouth  daily. [place] supplement otc  . ramelteon (ROZEREM) 8 MG tablet Take 1 tablet (8 mg total) by mouth at bedtime.  . sucralfate (CARAFATE) 1 g tablet Take 1 tablet (1 g total) by mouth 4 (four) times daily -  with meals and at bedtime.  . Turmeric 400 MG CAPS Take 1 capsule by mouth 2 (two) times daily at 10 AM and 5 PM.  . Vitamin D, Ergocalciferol, (DRISDOL) 1.25 MG (50000 UNIT) CAPS capsule Take 1 capsule (50,000 Units total) by mouth every 7 (seven) days.  Marland Kitchen zinc gluconate 50 MG tablet Take 50 mg by mouth daily.   No facility-administered encounter medications on file as of 01/01/2020.     Review of Systems  Review of Systems  Constitutional: Positive for fatigue. Negative for chills and fever.  HENT: Negative.     Respiratory: Positive for shortness of breath. Negative for cough.   Cardiovascular: Negative.  Negative for chest pain, palpitations and leg swelling.  Gastrointestinal: Negative.   Allergic/Immunologic: Negative.   Neurological: Negative.   Psychiatric/Behavioral: Negative.        Physical Exam  BP 112/70 (BP Location: Right Arm, Patient Position: Sitting, Cuff Size: Small)   Pulse 81   Temp (!) 97.5 F (36.4 C)   Ht 5\' 10"  (1.778 m)   Wt 180 lb 8 oz (81.9 kg)   SpO2 97%   BMI 25.90 kg/m   Wt Readings from Last 5 Encounters:  01/01/20 180 lb 8 oz (81.9 kg)  11/02/19 179 lb (81.2 kg)  10/04/19 185 lb (83.9 kg)  09/29/19 180 lb 3.2 oz (81.7 kg)  08/10/19 182 lb (82.6 kg)     Physical Exam Vitals and nursing note reviewed.  Constitutional:      General: He is not in acute distress.    Appearance: He is well-developed.  Cardiovascular:     Rate and Rhythm: Normal rate and regular rhythm.  Pulmonary:     Effort: Pulmonary effort is normal. No respiratory distress.     Breath sounds: Normal breath sounds. No wheezing or rhonchi.  Abdominal:     Tenderness: There is abdominal tenderness (right upper quadrant).  Musculoskeletal:     Right lower leg: No edema.     Left lower leg: No edema.  Skin:    General: Skin is warm and dry.  Neurological:     Mental Status: He is alert and oriented to person, place, and time.  Psychiatric:        Mood and Affect: Mood normal.        Behavior: Behavior normal.         Assessment & Plan:   History of 2019 novel coronavirus disease (COVID-19) Shortness of breath Right upper quadrant pain Right sided chest wall pain:  Assessment: - Right upper quadrant pain noted upon palpation during exam today  Plan: Please start Carafate as directed - shortness of breath could be related to reflux  Please keep upcoming appointment with GI - please discuss right upper quadrant pain - concerned for gallbladder issues  Patient  walked in office today - O2 sats remained above 94% for entire walk heart rate stable  Will order chest x ray and call with results   Fatigue:  Stay active  Continue vitamin D and B12 as prescribed by PCP  Please get lab work completed that was ordered to rule out autoimmune disorder by podiatrist   Costochondritis:  Hand out given on supportive care for chostochondritis     Patient  Instructions  Shortness of breath Right upper quadrant pain Right sided chest wall pain:  Please start Carafate as directed - shortness of breath could be related to reflux  Please keep upcoming appointment with GI - please discuss right upper quadrant pain - concerned for gallbladder issues  Patient walked in office today - O2 sats remained above 94% for entire walk heart rate stable  Will order chest x ray and call with results   Fatigue:  Stay active  Continue vitamin D and B12 as prescribed by PCP  Please get lab work completed that was ordered to rule out autoimmune disorder by podiatrist   Costochondritis: Costochondritis Costochondritis is swelling and irritation (inflammation) of the tissue (cartilage) that connects your ribs to your breastbone (sternum). This causes pain in the front of your chest. Usually, the pain:  Starts gradually.  Is in more than one rib. This condition usually goes away on its own over time. Follow these instructions at home:  Do not do anything that makes your pain worse.  If directed, put ice on the painful area: ? Put ice in a plastic bag. ? Place a towel between your skin and the bag. ? Leave the ice on for 20 minutes, 2-3 times a day.  If directed, put heat on the affected area as often as told by your doctor. Use the heat source that your doctor tells you to use, such as a moist heat pack or a heating pad. ? Place a towel between your skin and the heat source. ? Leave the heat on for 20-30 minutes. ? Take off the heat if your skin turns  bright red. This is very important if you cannot feel pain, heat, or cold. You may have a greater risk of getting burned.  Take over-the-counter and prescription medicines only as told by your doctor.  Return to your normal activities as told by your doctor. Ask your doctor what activities are safe for you.  Keep all follow-up visits as told by your doctor. This is important. Contact a doctor if:  You have chills or a fever.  Your pain does not go away or it gets worse.  You have a cough that does not go away. Get help right away if:  You are short of breath. This information is not intended to replace advice given to you by your health care provider. Make sure you discuss any questions you have with your health care provider. Document Revised: 07/28/2017 Document Reviewed: 11/06/2015 Elsevier Patient Education  2020 [place].   Follow up:  As needed      Fenton Foy, NP 01/01/2020

## 2020-01-01 NOTE — Assessment & Plan Note (Signed)
Shortness of breath Right upper quadrant pain Right sided chest wall pain:  Assessment: - Right upper quadrant pain noted upon palpation during exam today  Plan: Please start Carafate as directed - shortness of breath could be related to reflux  Please keep upcoming appointment with GI - please discuss right upper quadrant pain - concerned for gallbladder issues  Patient walked in office today - O2 sats remained above 94% for entire walk heart rate stable  Will order chest x ray and call with results   Fatigue:  Stay active  Continue vitamin D and B12 as prescribed by PCP  Please get lab work completed that was ordered to rule out autoimmune disorder by podiatrist   Costochondritis:  Hand out given on supportive care for chostochondritis

## 2020-01-01 NOTE — Patient Instructions (Addendum)
Shortness of breath Right upper quadrant pain Right sided chest wall pain:  Please start Carafate as directed - shortness of breath could be related to reflux  Please keep upcoming appointment with GI - please discuss right upper quadrant pain - concerned for gallbladder issues  Patient walked in office today - O2 sats remained above 94% for entire walk heart rate stable  Will order chest x ray and call with results   Fatigue:  Stay active  Continue vitamin D and B12 as prescribed by PCP  Please get lab work completed that was ordered to rule out autoimmune disorder by podiatrist   Costochondritis: Costochondritis Costochondritis is swelling and irritation (inflammation) of the tissue (cartilage) that connects your ribs to your breastbone (sternum). This causes pain in the front of your chest. Usually, the pain:  Starts gradually.  Is in more than one rib. This condition usually goes away on its own over time. Follow these instructions at home:  Do not do anything that makes your pain worse.  If directed, put ice on the painful area: ? Put ice in a plastic bag. ? Place a towel between your skin and the bag. ? Leave the ice on for 20 minutes, 2-3 times a day.  If directed, put heat on the affected area as often as told by your doctor. Use the heat source that your doctor tells you to use, such as a moist heat pack or a heating pad. ? Place a towel between your skin and the heat source. ? Leave the heat on for 20-30 minutes. ? Take off the heat if your skin turns bright red. This is very important if you cannot feel pain, heat, or cold. You may have a greater risk of getting burned.  Take over-the-counter and prescription medicines only as told by your doctor.  Return to your normal activities as told by your doctor. Ask your doctor what activities are safe for you.  Keep all follow-up visits as told by your doctor. This is important. Contact a doctor if:  You have  chills or a fever.  Your pain does not go away or it gets worse.  You have a cough that does not go away. Get help right away if:  You are short of breath. This information is not intended to replace advice given to you by your health care provider. Make sure you discuss any questions you have with your health care provider. Document Revised: 07/28/2017 Document Reviewed: 11/06/2015 Elsevier Patient Education  2020 [employer].   Follow up:  As needed

## 2020-01-02 ENCOUNTER — Encounter: Payer: Self-pay | Admitting: Podiatry

## 2020-01-03 ENCOUNTER — Encounter: Payer: Self-pay | Admitting: Family Medicine

## 2020-01-03 ENCOUNTER — Ambulatory Visit
Admission: RE | Admit: 2020-01-03 | Discharge: 2020-01-03 | Disposition: A | Discharge status: Home / Self Care | Payer: 59 | Source: Ambulatory Visit | Attending: Nurse Practitioner | Admitting: Nurse Practitioner

## 2020-01-03 ENCOUNTER — Other Ambulatory Visit: Payer: Self-pay

## 2020-01-04 ENCOUNTER — Other Ambulatory Visit (INDEPENDENT_AMBULATORY_CARE_PROVIDER_SITE_OTHER): Payer: 59

## 2020-01-04 ENCOUNTER — Encounter: Payer: Self-pay | Admitting: Nurse Practitioner

## 2020-01-04 ENCOUNTER — Ambulatory Visit (INDEPENDENT_AMBULATORY_CARE_PROVIDER_SITE_OTHER): Payer: 59 | Admitting: Nurse Practitioner

## 2020-01-04 VITALS — BP 106/74 | HR 97 | Ht 71.0 in | Wt 173.4 lb

## 2020-01-04 DIAGNOSIS — R197 Diarrhea, unspecified: Secondary | ICD-10-CM

## 2020-01-04 DIAGNOSIS — K219 Gastro-esophageal reflux disease without esophagitis: Secondary | ICD-10-CM

## 2020-01-04 DIAGNOSIS — D649 Anemia, unspecified: Secondary | ICD-10-CM

## 2020-01-04 LAB — COMPREHENSIVE METABOLIC PANEL
ALT: 11 U/L (ref 0–53)
AST: 19 U/L (ref 0–37)
Albumin: 4.3 g/dL (ref 3.5–5.2)
Alkaline Phosphatase: 80 U/L (ref 39–117)
BUN: 5 mg/dL — ABNORMAL LOW (ref 6–23)
CO2: 27 mEq/L (ref 19–32)
Calcium: 9.3 mg/dL (ref 8.4–10.5)
Chloride: 102 mEq/L (ref 96–112)
Creatinine, Ser: 0.95 mg/dL (ref 0.40–1.50)
GFR: 114 mL/min (ref >60.00)
Glucose, Bld: 93 mg/dL (ref 70–99)
Potassium: 3.8 mEq/L (ref 3.5–5.1)
Sodium: 136 mEq/L (ref 135–145)
Total Bilirubin: 0.3 mg/dL (ref 0.2–1.2)
Total Protein: 9 g/dL — ABNORMAL HIGH (ref 6.0–8.3)

## 2020-01-04 LAB — CBC WITH DIFFERENTIAL/PLATELET
Basophils Absolute: 0 10*3/uL (ref 0.0–0.1)
Basophils Relative: 0.4 % (ref 0.0–3.0)
Eosinophils Absolute: 0 10*3/uL (ref 0.0–0.7)
Eosinophils Relative: 0.5 % (ref 0.0–5.0)
HCT: 40.2 % (ref 39.0–52.0)
Hemoglobin: 13.2 g/dL (ref 13.0–17.0)
Lymphocytes Relative: 22.8 % (ref 12.0–46.0)
Lymphs Abs: 1.7 10*3/uL (ref 0.7–4.0)
MCHC: 32.8 g/dL (ref 30.0–36.0)
MCV: 88.5 fl (ref 78.0–100.0)
Monocytes Absolute: 1 10*3/uL (ref 0.1–1.0)
Monocytes Relative: 13.6 % — ABNORMAL HIGH (ref 3.0–12.0)
Neutro Abs: 4.8 10*3/uL (ref 1.4–7.7)
Neutrophils Relative %: 62.7 % (ref 43.0–77.0)
Platelets: 350 10*3/uL (ref 150.0–400.0)
RBC: 4.54 Mil/uL (ref 4.22–5.81)
RDW: 15.3 % (ref 11.5–15.5)
WBC: 7.6 10*3/uL (ref 4.0–10.5)

## 2020-01-04 MED ORDER — OMEPRAZOLE 20 MG PO CPDR: 20.0000 mg | DELAYED_RELEASE_CAPSULE | Freq: Every day | ORAL | 3 refills | Status: AC

## 2020-01-04 NOTE — Telephone Encounter (Signed)
Ok for refill? 

## 2020-01-04 NOTE — Patient Instructions (Addendum)
If you are age 28 or older, your body mass index should be between 23-30. Your Body mass index is 24.18 kg/m. If this is out of the aforementioned range listed, please consider follow up with your Primary Care Provider.  If you are age 19 or younger, your body mass index should be between 19-25. Your Body mass index is 24.18 kg/m. If this is out of the aformentioned range listed, please consider follow up with your Primary Care Provider.   Your provider has requested that you go to the basement level for lab work before leaving today. Press "B" on the elevator. The lab is located at the first door on the left as you exit the elevator.  We have sent the following medications to your pharmacy for you to pick up at your convenience: Omeprazole 20 mg daily.

## 2020-01-04 NOTE — Progress Notes (Addendum)
ASSESSMENT / PLAN:   29 year old male with PMH significant for Covid infection, GERD, and asthma  # GERD with pyrosis / early satiety --Carafate improving pyrosis but not the early satiety / " indigestion"  --Trial of Omeprazole 20 mg qam --If symptoms do not improve consider EGD  # Loose stool --No recent anal intercourse but still need to rule out infectious etiology. Obtain GI profile panel.  --If stool studies negative and has persistent loose stool then will proceed with colonoscopy  # Mild [place] anemia.  --Hgb ~ 12 in March 2021, down a gram from July 2020.  --Repeat CBC today.   # Elevated inflammatory markers --ESR 115, CRP 35 --Studies ordered by Podiatry for evaluation of tendonitis it appears but still need to consider GI source in setting of loose stool. Marland Kitchen      HPI:     Chief Complaint: GI issues   Keona Sheffler is a 29 year old male referred by PCP for evaluation of GERD.  Patient was told by ED in 2018 that he probably had GERD   His symptoms have included regurgitation, heartburn.  In 2018 he was prescribed Carafate and another unknown medication.  He took these medications for 6 to 7 months and did well.  After stopping the medication he continued to do well up until a few weeks ago.  Since then will he has been having recurrent heartburn and " indigestion" he feels like food does not digest but lingers in his stomach.  He gets full easily, feels like even water makes him full.  He recently resumed Carafate and feels like the heartburn is better but not the indigestion.  No associated nausea.  His weight has been stable.  Addition to above Bernice has been having loose stool for probably 3 weeks now.  He is having some solid stool but is averaging 4-5 loose, nonbloody stools a day , most days of the week.  No abdominal pain just bubbling sounds in his stomach prior to defecation.  Patient does participate in anal intercourse but has not done so in the last  several weeks.  He has not had any contact with anyone with similar symptoms.  No recent medication changes nor dietary changes to account for the loose stool.  He has not had any recent antibiotics.   Data Reviewed:  01/03/20 CRP 35 ESR 115  10/04/19 hgb 12.3 MCV 90 TSH 2.7   Past Medical History:  Diagnosis Date  . Childhood asthma   . COVID-19     History reviewed. No pertinent surgical history. Family History  Problem Relation Age of Onset  . Sarcoidosis Mother   . Asthma Neg Hx   . Colon cancer Neg Hx   . Esophageal cancer Neg Hx    Social History   Tobacco Use  . Smoking status: Former Research scientist (life sciences)  . Smokeless tobacco: Never Used  Vaping Use  . Vaping Use: Never used  Substance Use Topics  . Alcohol use: Yes    Alcohol/week: 4.0 standard drinks    Types: 4 Glasses of wine per week  . Drug use: Never   Current Outpatient Medications  Medication Sig Dispense Refill  . albuterol (VENTOLIN HFA) 108 (90 Base) MCG/ACT inhaler Inhale 2 puffs into the lungs every 6 (six) hours as needed for wheezing or shortness of breath. 6.7 g 5  . Ascorbic Acid (VITAMIN C) 100 MG tablet Take 100 mg by mouth daily.    Marland Kitchen  aspirin 325 MG tablet Take 325 mg by mouth 2 (two) times a week.    Marland Kitchen b complex vitamins tablet Take 1 tablet by mouth daily.    . Budesonide (PULMICORT FLEXHALER) 90 MCG/ACT inhaler Inhale 1 puff into the lungs 2 (two) times daily. 1 each 5  . Echinacea 125 MG CAPS Take 1 capsule by mouth daily.    . fluticasone (FLOVENT HFA) 220 MCG/ACT inhaler Inhale 1 puff into the lungs 2 (two) times daily. 1 Inhaler 5  . Ginger 500 MG CAPS Take 1 capsule by mouth daily.    Marland Kitchen ibuprofen (ADVIL) 600 MG tablet Take 1 tablet (600 mg total) by mouth every 8 (eight) hours as needed. 30 tablet 0  . Multiple Vitamin (MULTIVITAMIN WITH MINERALS) TABS tablet Take 1 tablet by mouth daily.    Marland Kitchen OVER THE COUNTER MEDICATION Take 1 capsule by mouth daily. [place] supplement otc    . ramelteon  (ROZEREM) 8 MG tablet Take 1 tablet (8 mg total) by mouth at bedtime. 30 tablet 2  . sucralfate (CARAFATE) 1 g tablet Take 1 tablet (1 g total) by mouth 4 (four) times daily -  with meals and at bedtime. 90 tablet 2  . Turmeric 400 MG CAPS Take 1 capsule by mouth 2 (two) times daily at 10 AM and 5 PM.    . Vitamin D, Ergocalciferol, (DRISDOL) 1.25 MG (50000 UNIT) CAPS capsule Take 1 capsule (50,000 Units total) by mouth every 7 (seven) days. 12 capsule 0  . zinc gluconate 50 MG tablet Take 50 mg by mouth daily.     No current facility-administered medications for this visit.   No Known Allergies   Review of Systems: All systems reviewed and negative except where noted in HPI.   Creatinine clearance cannot be calculated (Patient's most recent lab result is older than the maximum 21 days allowed.)   Physical Exam:    Wt Readings from Last 3 Encounters:  01/04/20 173 lb 6 oz (78.6 kg)  01/01/20 180 lb 8 oz (81.9 kg)  11/02/19 179 lb (81.2 kg)    BP 106/74   Pulse 97   Ht '5\' 11"'$  (1.803 m)   Wt 173 lb 6 oz (78.6 kg)   BMI 24.18 kg/m  Constitutional:  Pleasant male in no acute distress. Psychiatric: Normal mood and affect. Behavior is normal. EENT: Pupils normal.  Conjunctivae are normal. No scleral icterus. Neck supple.  Cardiovascular: Normal rate, regular rhythm. No edema Pulmonary/chest: Effort normal and breath sounds normal. No wheezing, rales or rhonchi. Abdominal: Soft, nondistended, nontender. Bowel sounds active throughout. There are no masses palpable. No hepatomegaly. Neurological: Alert and oriented to person place and time. Skin: Skin is warm and dry. No rashes noted.  Tye Savoy, NP  01/04/2020, 11:08 AM  Cc:  Referring Provider Billie Ruddy, MD

## 2020-01-05 ENCOUNTER — Encounter: Payer: Self-pay | Admitting: Nurse Practitioner

## 2020-01-05 LAB — FECAL LACTOFERRIN, QUANT
Fecal Lactoferrin: POSITIVE — AB
MICRO NUMBER:: 10576244
SPECIMEN QUALITY:: ADEQUATE

## 2020-01-06 LAB — GI PROFILE, STOOL, PCR
Adenovirus F 40/41: NOT DETECTED
Astrovirus: NOT DETECTED
C difficile toxin A/B: NOT DETECTED
Campylobacter: NOT DETECTED
Cryptosporidium: NOT DETECTED
Cyclospora cayetanensis: NOT DETECTED
Entamoeba histolytica: NOT DETECTED
Enteroaggregative E coli: NOT DETECTED
Enteropathogenic E coli: NOT DETECTED
Enterotoxigenic E coli: NOT DETECTED
Giardia lamblia: DETECTED — AB
Norovirus GI/GII: NOT DETECTED
Plesiomonas shigelloides: NOT DETECTED
Rotavirus A: NOT DETECTED
Salmonella: NOT DETECTED
Sapovirus: NOT DETECTED
Shiga-toxin-producing E coli: NOT DETECTED
Shigella/Enteroinvasive E coli: NOT DETECTED
Vibrio cholerae: NOT DETECTED
Vibrio: NOT DETECTED
Yersinia enterocolitica: NOT DETECTED

## 2020-01-06 LAB — ANTI-NUCLEAR AB-TITER (ANA TITER)
ANA TITER: 1:80 {titer} — ABNORMAL HIGH
ANA Titer 1: 1:80 {titer} — ABNORMAL HIGH

## 2020-01-06 LAB — ANA: Anti Nuclear Antibody (ANA): POSITIVE — AB

## 2020-01-06 LAB — SEDIMENTATION RATE: Sed Rate: 115 mm/h — ABNORMAL HIGH (ref 0–15)

## 2020-01-06 LAB — HLA-B27 ANTIGEN: HLA-B27 Antigen: NEGATIVE

## 2020-01-06 LAB — RHEUMATOID FACTOR: Rheumatoid fact SerPl-aCnc: <14 IU/mL (ref <14)

## 2020-01-06 LAB — C-REACTIVE PROTEIN: CRP: 35.2 mg/L — ABNORMAL HIGH (ref <8.0)

## 2020-01-08 ENCOUNTER — Encounter: Payer: Self-pay | Admitting: Family Medicine

## 2020-01-08 NOTE — Progress Notes (Signed)
Agree with assessment and plan as outlined.  

## 2020-01-08 NOTE — Telephone Encounter (Signed)
Called the pharmacy no answer

## 2020-01-09 ENCOUNTER — Telehealth: Payer: Self-pay | Admitting: *Deleted

## 2020-01-09 ENCOUNTER — Telehealth: Payer: Self-pay | Admitting: Nurse Practitioner

## 2020-01-09 ENCOUNTER — Other Ambulatory Visit: Payer: Self-pay

## 2020-01-09 MED ORDER — METRONIDAZOLE 500 MG PO TABS
500.0000 mg | ORAL_TABLET | Freq: Two times a day (BID) | ORAL | 0 refills | Status: AC
Start: 2020-01-09 — End: 2020-01-16

## 2020-01-09 NOTE — Telephone Encounter (Signed)
Spoke with the patient. Advised of his lab results. States he is doing better on PPI. He is agreeable to holding off on the EGD for now. He will call with an update of his progress in 10 to 14 days.

## 2020-01-09 NOTE — Telephone Encounter (Signed)
Pt inquired about lab results.  °

## 2020-01-09 NOTE — Telephone Encounter (Signed)
-----   Message from Vivi Barrack, DPM sent at 01/08/2020  8:21 AM EDT ----- Please let him know that the inflammatory markers are elevated as well as ANA. Can you please refer him to rheumatology? Please send blood work and MRI results. Thanks.

## 2020-01-09 NOTE — Telephone Encounter (Signed)
Left message requesting pt call for results and instructions. 

## 2020-01-12 ENCOUNTER — Telehealth: Payer: Self-pay

## 2020-01-12 ENCOUNTER — Telehealth: Payer: Self-pay | Admitting: *Deleted

## 2020-01-12 DIAGNOSIS — M1990 Unspecified osteoarthritis, unspecified site: Secondary | ICD-10-CM

## 2020-01-12 DIAGNOSIS — M76829 Posterior tibial tendinitis, unspecified leg: Secondary | ICD-10-CM

## 2020-01-12 DIAGNOSIS — G8929 Other chronic pain: Secondary | ICD-10-CM

## 2020-01-12 DIAGNOSIS — M779 Enthesopathy, unspecified: Secondary | ICD-10-CM

## 2020-01-12 NOTE — Telephone Encounter (Signed)
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

## 2020-01-12 NOTE — Telephone Encounter (Signed)
I informed pt of Dr. Gabriel Rung review of results and orders. Faxed referral to [facility]. Faxed referral form, clinicals and demographics to [facility].

## 2020-01-12 NOTE — Telephone Encounter (Signed)
-----   Message from Matthew R Wagoner, DPM sent at 01/08/2020  8:21 AM EDT ----- Please let him know that the inflammatory markers are elevated as well as ANA. Can you please refer him to rheumatology? Please send blood work and MRI results. Thanks.  

## 2020-01-15 ENCOUNTER — Telehealth: Payer: Self-pay | Admitting: Podiatry

## 2020-01-15 NOTE — Telephone Encounter (Signed)
Pt aware orthotics are an exclsuion from his insurance plan.

## 2020-01-22 ENCOUNTER — Telehealth: Payer: Self-pay | Admitting: Family Medicine

## 2020-01-22 NOTE — Telephone Encounter (Signed)
Kim from [facility] stated they received a fax stating the pt can return to work at a reduce schedule. She is needing more information on what hours and limits/restriction he has?   Phone: [phone] ask for Selena Batten

## 2020-01-23 ENCOUNTER — Ambulatory Visit: Payer: 59 | Admitting: Physician Assistant

## 2020-01-24 NOTE — Telephone Encounter (Signed)
Please advise 

## 2020-01-25 NOTE — Telephone Encounter (Signed)
Noted  

## 2020-01-25 NOTE — Telephone Encounter (Signed)
Form was completed stating 4 hours/day and limited lifting with increased activity as tolerated.

## 2020-02-13 ENCOUNTER — Telehealth: Payer: Self-pay | Admitting: Internal Medicine

## 2020-02-13 ENCOUNTER — Telehealth: Payer: Self-pay | Admitting: Family Medicine

## 2020-02-13 NOTE — Telephone Encounter (Signed)
You are covering for Dr. Celine Mans today. Please do the peer to peer requested for this patient. Patient has only ever seen Dr. Celine Mans.   Dr. Nicole Kindred office calling to let Dr. Celine Mans know that Dr. Darcus Austin wants to do a peer-to-peer to discuss any limitations or restriction dr. Celine Mans may have on the pt [phone]

## 2020-02-13 NOTE — Telephone Encounter (Signed)
Please advise 

## 2020-02-13 NOTE — Telephone Encounter (Signed)
Dr. Gwynneth Aliment [phone]) would like to have a Peer to Peer to discuss restrictions that Dr. Salomon Fick may have on the patient.

## 2020-02-15 NOTE — Telephone Encounter (Signed)
MR, please advise.  

## 2020-02-16 NOTE — Telephone Encounter (Signed)
Please refer provider to documented notes in regards to patient's condition.

## 2020-02-20 NOTE — Telephone Encounter (Signed)
Attempted to call the number provided in the message is not in service

## 2020-02-21 NOTE — Telephone Encounter (Signed)
Rec'd faxed request for physician review from [employer] - place in Dr. Trena Platt box for review since he is covering Dr. Humphrey Rolls paperwork etc. For today -pr

## 2020-03-04 ENCOUNTER — Encounter: Payer: Self-pay | Admitting: Family Medicine

## 2020-03-05 ENCOUNTER — Encounter: Payer: Self-pay | Admitting: Family Medicine

## 2020-03-07 ENCOUNTER — Other Ambulatory Visit: Payer: Self-pay | Admitting: Family Medicine

## 2020-03-07 DIAGNOSIS — G4709 Other insomnia: Secondary | ICD-10-CM

## 2020-03-07 MED ORDER — RAMELTEON 8 MG PO TABS: 8.0000 mg | ORAL_TABLET | Freq: Every day | ORAL | 4 refills | Status: AC

## 2020-03-14 ENCOUNTER — Encounter: Payer: Self-pay | Admitting: Family Medicine

## 2020-03-18 NOTE — Telephone Encounter (Signed)
Dr. Wynona Neat will address on 03/19/2020.

## 2020-03-18 NOTE — Telephone Encounter (Signed)
Shyerl will  You please follow up with his as Dr. Wynona Neat was the last to have paperwork.

## 2020-03-19 ENCOUNTER — Encounter: Payer: Self-pay | Admitting: Family Medicine

## 2020-03-20 ENCOUNTER — Telehealth: Payer: Self-pay | Admitting: Pulmonary Disease

## 2020-03-20 NOTE — Telephone Encounter (Signed)
Information given to Dr. Wynona Neat.

## 2020-04-02 ENCOUNTER — Encounter: Payer: Self-pay | Admitting: Family Medicine

## 2020-04-29 ENCOUNTER — Ambulatory Visit (INDEPENDENT_AMBULATORY_CARE_PROVIDER_SITE_OTHER): Payer: 59 | Admitting: Family Medicine

## 2020-04-29 ENCOUNTER — Encounter: Payer: Self-pay | Admitting: Family Medicine

## 2020-04-29 ENCOUNTER — Other Ambulatory Visit: Payer: Self-pay

## 2020-04-29 VITALS — BP 110/78 | HR 65 | Temp 98.0°F | Wt 182.0 lb

## 2020-04-29 DIAGNOSIS — M25473 Effusion, unspecified ankle: Secondary | ICD-10-CM | POA: Diagnosis not present

## 2020-04-29 DIAGNOSIS — U099 Post covid-19 condition, unspecified: Secondary | ICD-10-CM

## 2020-04-29 DIAGNOSIS — M25571 Pain in right ankle and joints of right foot: Secondary | ICD-10-CM | POA: Diagnosis not present

## 2020-04-29 DIAGNOSIS — K649 Unspecified hemorrhoids: Secondary | ICD-10-CM | POA: Diagnosis not present

## 2020-04-29 DIAGNOSIS — B20 Human immunodeficiency virus [HIV] disease: Secondary | ICD-10-CM

## 2020-04-29 DIAGNOSIS — G8929 Other chronic pain: Secondary | ICD-10-CM

## 2020-04-29 NOTE — Patient Instructions (Signed)
Ankle Pain The ankle joint holds your body weight and allows you to move around. Ankle pain can occur on either side or the back of one ankle or both ankles. Ankle pain may be sharp and burning or dull and aching. There may be tenderness, stiffness, redness, or warmth around the ankle. Many things can cause ankle pain, including an injury to the area and overuse of the ankle. Follow these instructions at home: Activity  Rest your ankle as told by your health care provider. Avoid any activities that cause ankle pain.  Do not use the injured limb to support your body weight until your health care provider says that you can. Use crutches as told by your health care provider.  Do exercises as told by your health care provider.  Ask your health care provider when it is safe to drive if you have a brace on your ankle. If you have a brace:  Wear the brace as told by your health care provider. Remove it only as told by your health care provider.  Loosen the brace if your toes tingle, become numb, or turn cold and blue.  Keep the brace clean.  If the brace is not waterproof: ? Do not let it get wet. ? Cover it with a watertight covering when you take a bath or shower. If you were given an elastic bandage:   Remove it when you take a bath or a shower.  Try not to move your ankle very much, but wiggle your toes from time to time. This helps to prevent swelling.  Adjust the bandage to make it more comfortable if it feels too tight.  Loosen the bandage if you have numbness or tingling in your foot or if your foot turns cold and blue. Managing pain, stiffness, and swelling   If directed, put ice on the painful area. ? If you have a removable brace or elastic bandage, remove it as told by your health care provider. ? Put ice in a plastic bag. ? Place a towel between your skin and the bag. ? Leave the ice on for 20 minutes, 2-3 times a day.  Move your toes often to avoid stiffness and to  lessen swelling.  Raise (elevate) your ankle above the level of your heart while you are sitting or lying down. General instructions  Record information about your pain. Writing down the following may be helpful for you and your health care provider: ? How often you have ankle pain. ? Where the pain is located. ? What the pain feels like.  If treatment involves wearing a prescribed shoe or insole, make sure you wear it correctly and for as long as told by your health care provider.  Take over-the-counter and prescription medicines only as told by your health care provider.  Keep all follow-up visits as told by your health care provider. This is important. Contact a health care provider if:  Your pain gets worse.  Your pain is not relieved with medicines.  You have a fever or chills.  You are having more trouble with walking.  You have new symptoms. Get help right away if:  Your foot, leg, toes, or ankle: ? Tingles or becomes numb. ? Becomes swollen. ? Turns pale or blue. Summary  Ankle pain can occur on either side or the back of one ankle or both ankles.  Ankle pain may be sharp and burning or dull and aching.  Rest your ankle as told by your health care provider.   If told, apply ice to the area.  Take over-the-counter and prescription medicines only as told by your health care provider. This information is not intended to replace advice given to you by your health care provider. Make sure you discuss any questions you have with your health care provider. Document Revised: 11/01/2018 Document Reviewed: 01/19/2018 Elsevier Patient Education  2020 [employer].  Hemorrhoids Hemorrhoids are swollen veins in and around the rectum or anus. There are two types of hemorrhoids:  Internal hemorrhoids. These occur in the veins that are just inside the rectum. They may poke through to the outside and become irritated and painful.  External hemorrhoids. These occur in the veins  that are outside the anus and can be felt as a painful swelling or hard lump near the anus. Most hemorrhoids do not cause serious problems, and they can be managed with home treatments such as diet and lifestyle changes. If home treatments do not help the symptoms, procedures can be done to shrink or remove the hemorrhoids. What are the causes? This condition is caused by increased pressure in the anal area. This pressure may result from various things, including:  Constipation.  Straining to have a bowel movement.  Diarrhea.  Pregnancy.  Obesity.  Sitting for long periods of time.  Heavy lifting or other activity that causes you to strain.  Anal sex.  Riding a bike for a long period of time. What are the signs or symptoms? Symptoms of this condition include:  Pain.  Anal itching or irritation.  Rectal bleeding.  Leakage of stool (feces).  Anal swelling.  One or more lumps around the anus. How is this diagnosed? This condition can often be diagnosed through a visual exam. Other exams or tests may also be done, such as:  An exam that involves feeling the rectal area with a gloved hand (digital rectal exam).  An exam of the anal canal that is done using a small tube (anoscope).  A blood test, if you have lost a significant amount of blood.  A test to look inside the colon using a flexible tube with a camera on the end (sigmoidoscopy or colonoscopy). How is this treated? This condition can usually be treated at home. However, various procedures may be done if dietary changes, lifestyle changes, and other home treatments do not help your symptoms. These procedures can help make the hemorrhoids smaller or remove them completely. Some of these procedures involve surgery, and others do not. Common procedures include:  Rubber band ligation. Rubber bands are placed at the base of the hemorrhoids to cut off their blood supply.  Sclerotherapy. Medicine is injected into the  hemorrhoids to shrink them.  Infrared coagulation. A type of light energy is used to get rid of the hemorrhoids.  Hemorrhoidectomy surgery. The hemorrhoids are surgically removed, and the veins that supply them are tied off.  Stapled hemorrhoidopexy surgery. The surgeon staples the base of the hemorrhoid to the rectal wall. Follow these instructions at home: Eating and drinking   Eat foods that have a lot of fiber in them, such as whole grains, beans, nuts, fruits, and vegetables.  Ask your health care provider about taking products that have added fiber (fiber supplements).  Reduce the amount of fat in your diet. You can do this by eating low-fat dairy products, eating less red meat, and avoiding processed foods.  Drink enough fluid to keep your urine pale yellow. Managing pain and swelling   Take warm sitz baths for 20  minutes, 3-4 times a day to ease pain and discomfort. You may do this in a bathtub or using a portable sitz bath that fits over the toilet.  If directed, apply ice to the affected area. Using ice packs between sitz baths may be helpful. ? Put ice in a plastic bag. ? Place a towel between your skin and the bag. ? Leave the ice on for 20 minutes, 2-3 times a day. General instructions  Take over-the-counter and prescription medicines only as told by your health care provider.  Use medicated creams or suppositories as told.  Get regular exercise. Ask your health care provider how much and what kind of exercise is best for you. In general, you should do moderate exercise for at least 30 minutes on most days of the week (150 minutes each week). This can include activities such as walking, biking, or yoga.  Go to the bathroom when you have the urge to have a bowel movement. Do not wait.  Avoid straining to have bowel movements.  Keep the anal area dry and clean. Use wet toilet paper or moist towelettes after a bowel movement.  Do not sit on the toilet for long  periods of time. This increases blood pooling and pain.  Keep all follow-up visits as told by your health care provider. This is important. Contact a health care provider if you have:  Increasing pain and swelling that are not controlled by treatment or medicine.  Difficulty having a bowel movement, or you are unable to have a bowel movement.  Pain or inflammation outside the area of the hemorrhoids. Get help right away if you have:  Uncontrolled bleeding from your rectum. Summary  Hemorrhoids are swollen veins in and around the rectum or anus.  Most hemorrhoids can be managed with home treatments such as diet and lifestyle changes.  Taking warm sitz baths can help ease pain and discomfort.  In severe cases, procedures or surgery can be done to shrink or remove the hemorrhoids. This information is not intended to replace advice given to you by your health care provider. Make sure you discuss any questions you have with your health care provider. Document Revised: 12/09/2018 Document Reviewed: 12/02/2017 Elsevier Patient Education  2020 [employer] Inc[employer]  Nonsurgical Procedures for Hemorrhoids  Nonsurgical procedures can be used to treat hemorrhoids. Hemorrhoids are swollen veins that are inside the rectum (internal hemorrhoids) or around the anus (external hemorrhoids). They are caused by increased pressure in the anal area. This pressure may result from straining to have a bowel movement (constipation), diarrhea, pregnancy, obesity, anal sex, or sitting for long periods of time. Hemorrhoids can cause symptoms such as pain and bleeding. Various procedures may be done if diet changes, lifestyle changes, and other home treatments do not help your symptoms. Some of these procedures do not involve surgery. Tell a health care provider about:  Any allergies you have.  All medicines you are taking, including vitamins, herbs, eye drops, creams, and over-the-counter medicines.  Any problems  you or family members have had with anesthetic medicines.  Any blood disorders you have.  Any surgeries you have had.  Any medical conditions you have.  Whether you are pregnant or may be pregnant. What are the risks? Generally, this is a safe procedure. However, problems may occur, including:  Infection.  Bleeding.  Pain. What happens before the procedure?  Ask your health care provider about: ? Changing or stopping your regular medicines. This is especially important if you are taking  diabetes medicines or blood thinners. ? Taking medicines such as aspirin and ibuprofen. These medicines can thin your blood. Do not take these medicines unless your health care provider tells you to take them. ? Taking over-the-counter medicines, vitamins, herbs, and supplements.  Follow instructions from your health care provider about eating or drinking restrictions.  You may need to have a procedure to examine the inside of your colon with a scope (colonoscopy). Your health care provider may do this to make sure that there are no other causes for your bleeding or pain. What happens during the procedure?   Your health care provider will clean your rectal area with a rinsing solution.  A lubricating jelly may be placed into your rectum. The jelly may contain a medicine to numb the area (local anesthetic).  Your health care provider will insert a short scope (anoscope) into your rectum to examine the hemorrhoids.  One of the following techniques will be used: ? Rubber band ligation. Your health care provider will place medical instruments through the scope to put rubber bands around the base of your hemorrhoids. The bands will cut off the blood supply to the hemorrhoids. The hemorrhoids will fall off after several days. ? Sclerotherapy. Your health care provider will inject medicine through the scope into your hemorrhoids. This will cause them to shrink and dry up. ? Infrared coagulation. Your  health care provider will shine a type of light through the scope onto your hemorrhoids. This light will generate energy (infrared radiation). It will cause the hemorrhoids to scar and then fall off. Each of these procedures may vary among health care providers and hospitals. What happens after the procedure?  You will be monitored to make sure that you have no bleeding.  Return to your normal activities as told by your health care provider. Summary  Hemorrhoids are swollen veins that are inside the rectum (internal hemorrhoids) or around the anus (external hemorrhoids).  Nonsurgical procedures can be used to treat hemorrhoids.  Rubber band ligation, sclerotherapy, or infrared coagulation may be used if dietary and lifestyle changes do not cause your hemorrhoids to go away.  Before the procedure, ask your health care provider about changing or stopping your regular medicines. This information is not intended to replace advice given to you by your health care provider. Make sure you discuss any questions you have with your health care provider. Document Revised: 12/21/2018 Document Reviewed: 01/03/2018 Elsevier Patient Education  2020 Else[employer] Inc.  Surgical Procedures for Hemorrhoids Surgical procedures can be used to treat hemorrhoids. Hemorrhoids are swollen veins that are inside the rectum (internal hemorrhoids) or around the anus (external hemorrhoids). They are caused by increased pressure in the anal area. This pressure may result from straining to have a bowel movement (constipation), diarrhea, pregnancy, obesity, or sitting for long periods of time. Hemorrhoids can cause symptoms such as pain and bleeding. Surgery may be needed if diet changes, lifestyle changes, and other treatments do not help your symptoms. Common surgical methods that may be used include:  Closed hemorrhoidectomy. The hemorrhoids are surgically removed, and the incisions are closed with stitches  (sutures).  Open hemorrhoidectomy. The hemorrhoids are surgically removed, but the incisions are allowed to heal without sutures.  Stapled hemorrhoidectomy. The hemorrhoids are partially removed, and the incisions are closed with staples. Tell a health care provider about:  Any allergies you have.  All medicines you are taking, including vitamins, herbs, eye drops, creams, and over-the-counter medicines.  Any problems you or family  members have had with anesthetic medicines.  Any blood disorders you have.  Any surgeries you have had.  Any medical conditions you have.  Whether you are pregnant or may be pregnant. What are the risks? Generally, this is a safe procedure. However, problems may occur, including:  Infection.  Bleeding.  Allergic reactions to medicines.  Damage to other structures or organs.  Pain.  Constipation.  Difficulty passing urine.  Narrowing of the anal canal (stenosis).  Difficulty controlling bowel movements (incontinence).  Recurring hemorrhoids.  A new passage (fistula) that forms between the anus or rectum and another area. What happens before the procedure? Medicines  Ask your health care provider about: ? Changing or stopping your regular medicines. This is especially important if you are taking diabetes medicines or blood thinners. ? Taking medicines such as aspirin and ibuprofen. These medicines can thin your blood. Do not take these medicines unless your health care provider tells you to take them. ? Taking over-the-counter medicines, vitamins, herbs, and supplements. Staying hydrated Follow instructions from your health care provider about hydration, which may include:  Up to 2 hours before the procedure - you may continue to drink clear liquids, such as water, clear fruit juice, black coffee, and plain tea.  Eating and drinking Follow instructions from your health care provider about eating and drinking, which may include:  8  hours before the procedure - stop eating heavy meals or foods, such as meat, fried foods, or fatty foods.  6 hours before the procedure - stop eating light meals or foods, such as toast or cereal.  6 hours before the procedure - stop drinking milk or drinks that contain milk.  2 hours before the procedure - stop drinking clear liquids. General instructions  You may need to have a procedure to examine the inside of your colon with a scope (colonoscopy). Your health care provider may do this to make sure that there are no other causes for your bleeding or pain.  You may be instructed to take a laxative and an enema to clean out your colon before surgery (bowel prep). Carefully follow instructions from your health care provider about bowel prep.  Plan to have someone take you home from the hospital or clinic.  Plan to have a responsible adult care for you for at least 24 hours after you leave the hospital or clinic. This is important.  Ask your health care provider: ? How your surgery site will be marked. ? What steps will be taken to help prevent infection. These may include:  Washing skin with a germ-killing soap.  Taking antibiotic medicine. What happens during the procedure?  An IV will be inserted into one of your veins.  You will be given one or more of the following: ? A medicine to help you relax (sedative). ? A medicine to numb the area (local anesthetic). ? A medicine to make you fall asleep (general anesthetic). ? A medicine that is injected into an area of your body to numb everything below the injection site (regional anesthetic).  A lubricating jelly may be placed into your rectum.  Your surgeon will insert a short scope (anoscope) into your rectum to examine the hemorrhoids.  One of the following surgical methods will be used to remove the hemorrhoids: ? Closed hemorrhoidectomy.  Your surgeon will use surgical instruments to open the tissue around the  hemorrhoids.  The veins that supply the hemorrhoids will be tied off with a suture.  The hemorrhoids will be  removed.  The tissue that surrounds the hemorrhoids will be closed with sutures that your body can absorb (absorbable sutures). ? Open hemorrhoidectomy.  The hemorrhoids will be removed with surgical instruments.  The incisions will be left open to heal without sutures. ? Stapled hemorrhoidectomy.  Your surgeon will use a circular stapling device to partially remove the hemorrhoids.  The device will be inserted into your anus. It will remove a circular ring of tissue that includes hemorrhoid tissue and some tissue above the hemorrhoids.  The staples in the device will close the edges of the tissue. This will cut off the blood supply to any remaining hemorrhoids and pull the tissue back into place. Each of these procedures may vary among health care providers and hospitals. What happens after the procedure?  Your blood pressure, heart rate, breathing rate, and blood oxygen level may be monitored until you leave the hospital or clinic.  You will be given pain medicine as needed.  Do not drive for 24 hours if you were given a sedative during your procedure. Summary  Surgery may be needed for hemorrhoids if diet changes, lifestyle changes, and other treatments do not help your symptoms.  There are three common methods of surgery that are used to treat hemorrhoids.  Follow instructions from your health care provider about taking medicines and about eating and drinking before the procedure.  You may be instructed to take a laxative and an enema to clean out your colon before surgery (bowel prep). This information is not intended to replace advice given to you by your health care provider. Make sure you discuss any questions you have with your health care provider. Document Revised: 12/28/2018 Document Reviewed: 05/31/2018 Elsevier Patient Education  2020 [employer].

## 2020-04-29 NOTE — Progress Notes (Signed)
Subjective:    Patient ID: Tim Delgado, male    DOB: 1991-01-08, 29 y.o.   MRN: [id number]  No chief complaint on file.   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  Pt has been doing well for the most part.  Still having R ankle pain/edema.  Seen by podiatry for capsulitis of R foot.  Per pt Podiatry possible autoimmune cause and referral was placed to rheumatology for labs, however pt was unable to make appt.  Pt found out he is HIV positive in June. States he has come to terms with the dx.  Followed by [facility], Dr. Samara Snide.  Currently on Biktarvy.    Pt also notes hemorrhoids. Tried OTC meds.  Not currently causing issues. Interested in having them removed.  Inquires about the process.  Recently seen by [facility] for diarrhea which has also resolved..    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  Pt states he has been trying not to push himself if he starts to feel tired.  Has also started working with a Systems analyst.  Past Medical History:  Diagnosis Date  . Childhood asthma   . COVID-19     No Known Allergies  ROS General: Denies fever, chills, night sweats, changes in weight, changes in appetite +intermittent fatigue HEENT: Denies headaches, ear pain, changes in vision, rhinorrhea, sore throat CV: Denies CP, palpitations, SOB, orthopnea Pulm: Denies SOB, cough, wheezing GI: Denies abdominal pain, nausea, vomiting, diarrhea, constipation  + hemorrhoids GU: Denies dysuria, hematuria, frequency Msk: Denies muscle cramps, joint pains  + right ankle pain and edema. Neuro: Denies weakness, numbness, tingling Skin: Denies rashes, bruising Psych: Denies depression, anxiety, hallucinations      Objective:    Blood pressure 110/78, pulse 65, temperature 98 F (36.7 C), temperature source Oral, weight 182 lb (82.6 kg), SpO2 99 %.   Gen. Pleasant,  well-nourished, in no distress, normal affect   HEENT: [place]/AT, face symmetric, conjunctiva clear, no scleral icterus, PERRLA, EOMI, nares patent without drainage Lungs: no accessory muscle use, CTAB, no wheezes or rales Cardiovascular: RRR, no m/r/g, no peripheral edema Musculoskeletal: No deformities, no cyanosis or clubbing, normal tone Neuro:  A&Ox3, CN II-XII intact, normal gait Skin:  Warm, no lesions/ rash  Wt Readings from Last 3 Encounters:  04/29/20 182 lb (82.6 kg)  01/04/20 173 lb 6 oz (78.6 kg)  01/01/20 180 lb 8 oz (81.9 kg)    Lab Results  Component Value Date   WBC 7.6 01/04/2020   HGB 13.2 01/04/2020   HCT 40.2 01/04/2020   PLT 350.0 01/04/2020   GLUCOSE 93 01/04/2020   CHOL 125 10/04/2019   TRIG 119.0 10/04/2019   HDL 26.80 (L) 10/04/2019   LDLCALC 75 10/04/2019   ALT 11 01/04/2020   AST 19 01/04/2020   NA 136 01/04/2020   K 3.8 01/04/2020   CL 102 01/04/2020   CREATININE 0.95 01/04/2020   BUN 5 (L) 01/04/2020   CO2 27 01/04/2020   TSH 2.73 10/04/2019   HGBA1C 5.1 10/04/2019    Assessment/Plan:  Chronic pain of right ankle -worsening -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   -continue increasing physical activity as tolerated. -pt to return to work full time  Hemorrhoids, unspecified hemorrhoid type -resolved -likely multifactorial, thought pt recently dx'd with diarrhea 2/2 giardia b GI -discussed supportive care  -pt interested in removal. Has upcoming referral  -given handout  HIV disease (HCC) -stable -continue Biktarvy -counseling encouraged. -continue f/u with [facility], Dr. Samara Snide  F/u prn in 1 month  Abbe Amsterdam, MD

## 2020-04-30 ENCOUNTER — Encounter: Payer: Self-pay | Admitting: Podiatry

## 2020-04-30 ENCOUNTER — Encounter: Payer: Self-pay | Admitting: Family Medicine

## 2020-05-02 ENCOUNTER — Other Ambulatory Visit: Payer: Self-pay | Admitting: Podiatry

## 2020-05-02 DIAGNOSIS — G8929 Other chronic pain: Secondary | ICD-10-CM | POA: Insufficient documentation

## 2020-05-02 DIAGNOSIS — U099 Post covid-19 condition, unspecified: Secondary | ICD-10-CM | POA: Insufficient documentation

## 2020-05-02 DIAGNOSIS — B20 Human immunodeficiency virus [HIV] disease: Secondary | ICD-10-CM | POA: Insufficient documentation

## 2020-05-02 DIAGNOSIS — K649 Unspecified hemorrhoids: Secondary | ICD-10-CM | POA: Insufficient documentation

## 2020-05-02 MED ORDER — METHYLPREDNISOLONE 4 MG PO TBPK: ORAL_TABLET | ORAL | 0 refills | Status: DC

## 2020-05-06 ENCOUNTER — Telehealth: Payer: Self-pay | Admitting: Family Medicine

## 2020-05-06 ENCOUNTER — Encounter: Payer: Self-pay | Admitting: Podiatry

## 2020-05-06 NOTE — Progress Notes (Signed)
Received notification that patient did now show up for rheumatology appointment and will not be rescheduled

## 2020-05-06 NOTE — Telephone Encounter (Signed)
Pt call and didn't want to tell me what is going on he want you to give him a call at [phone] .

## 2020-05-08 NOTE — Telephone Encounter (Signed)
Pt is requesting to have documentation / letter stating to work only 30 hours a week due to his knee problems. Pt state to let him know when this is done so he can notify his employer as soon as possible. Please advise

## 2020-05-15 NOTE — Telephone Encounter (Signed)
Aware of patient's ankle/foot pain however not knee pain.

## 2020-05-17 NOTE — Telephone Encounter (Signed)
Left a detailed message for pt to call the office back regarding his request for a letter

## 2020-05-31 ENCOUNTER — Encounter: Payer: Self-pay | Admitting: Podiatry

## 2020-07-04 ENCOUNTER — Encounter: Payer: Self-pay | Admitting: Podiatry

## 2020-07-04 ENCOUNTER — Ambulatory Visit (INDEPENDENT_AMBULATORY_CARE_PROVIDER_SITE_OTHER): Payer: 59 | Admitting: Podiatry

## 2020-07-04 ENCOUNTER — Other Ambulatory Visit: Payer: Self-pay

## 2020-07-04 DIAGNOSIS — M1990 Unspecified osteoarthritis, unspecified site: Secondary | ICD-10-CM | POA: Diagnosis not present

## 2020-07-04 DIAGNOSIS — G8929 Other chronic pain: Secondary | ICD-10-CM

## 2020-07-04 DIAGNOSIS — M76829 Posterior tibial tendinitis, unspecified leg: Secondary | ICD-10-CM | POA: Diagnosis not present

## 2020-07-04 DIAGNOSIS — M779 Enthesopathy, unspecified: Secondary | ICD-10-CM

## 2020-07-04 DIAGNOSIS — M25571 Pain in right ankle and joints of right foot: Secondary | ICD-10-CM

## 2020-07-05 ENCOUNTER — Encounter: Payer: Self-pay | Admitting: Podiatry

## 2020-07-05 MED ORDER — MELOXICAM 15 MG PO TABS
15.0000 mg | ORAL_TABLET | Freq: Every day | ORAL | 0 refills | Status: DC
Start: 2020-07-05 — End: 2020-08-19

## 2020-07-08 NOTE — Progress Notes (Signed)
Subjective: 29 year old male presents the office today for follow-up evaluation of right ankle pain.  States that he is doing about the same.  After last appointment he was not able to follow-up with rheumatology until he canceled the appointment.  So his major discomfort points to the medial aspect the foot and ankle.  He states that when he works he does a lot of lifting which aggravates the symptoms. Denies any systemic complaints such as fevers, chills, nausea, vomiting. No acute changes since last appointment, and no other complaints at this time.   Objective: AAO x3, NAD DP/PT pulses palpable bilaterally, CRT less than 3 seconds Decreased medial arch upon weightbearing.  The majority discomfort is on the talonavicular joint on the right side today.  Minimal discomfort on the course the posterior tibial tendon.  There is localized edema but there is no erythema or warmth.  Flexor, extensor tendons appear to be intact.  MMT 5/5. No pain with calf compression, swelling, warmth, erythema  Assessment: 29 year old male with flatfoot deformity, arthritis  Plan: -All treatment options discussed with the patient including all alternatives, risks, complications.  -He was measured for orthotics today. -Prescribed mobic. Discussed side effects of the medication and directed to stop if any are to occur and call the office.  -Note was provided for restriction of work.  He can maintain working but no lifting is this aggravates his symptoms. -Given abnormal blood work will refer to rheumatology. -Patient encouraged to call the office with any questions, concerns, change in symptoms.   Vivi Barrack DPM

## 2020-08-01 ENCOUNTER — Ambulatory Visit: Payer: 59 | Admitting: Podiatry

## 2020-08-19 ENCOUNTER — Other Ambulatory Visit: Payer: Self-pay | Admitting: Podiatry

## 2020-08-19 ENCOUNTER — Encounter: Payer: Self-pay | Admitting: Podiatry

## 2020-08-19 ENCOUNTER — Ambulatory Visit: Payer: 59 | Admitting: Podiatry

## 2020-08-19 MED ORDER — MELOXICAM 15 MG PO TABS: 15.0000 mg | ORAL_TABLET | Freq: Every day | ORAL | 0 refills | Status: DC

## 2020-08-21 ENCOUNTER — Ambulatory Visit: Payer: 59 | Admitting: Internal Medicine

## 2020-08-25 ENCOUNTER — Other Ambulatory Visit: Payer: Self-pay | Admitting: Podiatry

## 2020-08-25 MED ORDER — MELOXICAM 15 MG PO TABS: 15.0000 mg | ORAL_TABLET | Freq: Every day | ORAL | 0 refills | Status: DC

## 2020-08-29 ENCOUNTER — Ambulatory Visit: Payer: 59 | Admitting: Podiatry

## 2020-09-16 ENCOUNTER — Ambulatory Visit (INDEPENDENT_AMBULATORY_CARE_PROVIDER_SITE_OTHER): Payer: 59 | Admitting: Podiatry

## 2020-09-16 ENCOUNTER — Other Ambulatory Visit: Payer: Self-pay

## 2020-09-16 DIAGNOSIS — G8929 Other chronic pain: Secondary | ICD-10-CM | POA: Diagnosis not present

## 2020-09-16 DIAGNOSIS — M76829 Posterior tibial tendinitis, unspecified leg: Secondary | ICD-10-CM | POA: Diagnosis not present

## 2020-09-16 DIAGNOSIS — M1990 Unspecified osteoarthritis, unspecified site: Secondary | ICD-10-CM | POA: Diagnosis not present

## 2020-09-16 DIAGNOSIS — M25571 Pain in right ankle and joints of right foot: Secondary | ICD-10-CM | POA: Diagnosis not present

## 2020-09-20 ENCOUNTER — Ambulatory Visit: Payer: 59 | Admitting: Internal Medicine

## 2020-09-20 NOTE — Progress Notes (Signed)
Subjective: 30 year old male presents the office today for follow-up evaluation of foot deformity, arthritis.  He has been using meloxicam he states this helps his discomfort.  Also has follow-up with the rheumatologist this week.  He presents the office to pick up orthotics.  No injury or changes otherwise. Denies any systemic complaints such as fevers, chills, nausea, vomiting. No acute changes since last appointment, and no other complaints at this time.   Objective: AAO x3, NAD DP/PT pulses palpable bilaterally, CRT less than 3 seconds There is decreased medial arch upon weightbearing.  Still tenderness on the toe navicular joint on the right side but improved.  No area of pinpoint tenderness.  No significant edema, erythema.  Flexor, extensor tendons appear to be intact. No pain with calf compression, swelling, warmth, erythema  Assessment: 30 year old male with flatfoot deformity, arthritis  Plan: -All treatment options discussed with the patient including all alternatives, risks, complications.  -Continue with Mobic for now.  Orthotics were dispensed by our pedorthotist, Rick.  Oral and written break-in instructions were provided. -Follow-up with rheumatology. -Discussed possible surgical intervention in the future if needed. -Patient encouraged to call the office with any questions, concerns, change in symptoms.   Vivi Barrack DPM

## 2020-10-01 ENCOUNTER — Other Ambulatory Visit: Payer: 59

## 2020-10-15 ENCOUNTER — Ambulatory Visit: Payer: 59 | Admitting: Podiatry

## 2020-10-20 NOTE — Progress Notes (Unsigned)
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 Patient denies injury. )   History of Present Illness: Tim Delgado is a 30 y.o. male with history of asthma, GERD, vitamin D deficiency, and HIV on Biktarvy here for evaluation of right ankle pain with flatfoot and posterior tibial tendon dysfunction with elevated inflammatory markers. He was treated with meloxicam but symptoms have been persistent. Around the time of initial visit for this he also suffered from GI symptoms and treated for giardia infection. 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 His ankle pain and swelling have continued at a moderate severity, and he takes NSAIDs with a good improvement, but does notice a return of swelling after stopping the medicine.  Labs reviewed 12/2019 ANA 1:80 speckled, cytoplasmic ESR 115 CRP 35.2 HLA-B27 neg RF neg  Imaging reviewed 11/2019 MRI right ankle IMPRESSION: 1. Subchondral marrow edema on either side of the talonavicular joint most severe in the anterior talus with surrounding soft tissue edema. Differential considerations include an inflammatory or posttraumatic arthropathy, but an inflammatory arthropathy cannot be completely excluded given the joint effusion and surrounding soft tissue edema. 2. Mild posterior tibial tendon tenosynovitis.  Activities of Daily Living:  Patient reports morning stiffness for several hours.   Patient Reports nocturnal pain.  Difficulty dressing/grooming: Reports Difficulty climbing stairs: Reports Difficulty getting out of chair: Reports Difficulty using hands for taps, buttons,  cutlery, and/or writing: Denies  Review of Systems  Constitutional: Negative for fatigue.  HENT: Negative for mouth sores, mouth dryness and nose dryness.   Eyes: Negative for pain, itching, visual disturbance and dryness.  Respiratory: Negative for cough, hemoptysis, shortness of breath and difficulty breathing.   Cardiovascular: Negative for chest pain, palpitations and swelling in legs/feet.  Gastrointestinal: Negative for abdominal pain, blood in stool, constipation and diarrhea.  Endocrine: Negative for increased urination.  Genitourinary: Negative for painful urination.  Musculoskeletal: Positive for arthralgias, joint pain, joint swelling and morning stiffness. Negative for myalgias, muscle weakness, muscle tenderness and myalgias.  Skin: Negative for color change, rash and redness.  Allergic/Immunologic: Negative for susceptible to infections.  Neurological: Negative for dizziness, numbness, headaches, memory loss and weakness.  Hematological: Negative for swollen glands.  Psychiatric/Behavioral: Negative for confusion and sleep disturbance.    PMFS History:  Patient Active Problem List   Diagnosis Date Noted  . Vitamin D deficiency 10/23/2020  . Positive ANA (antinuclear antibody) 10/21/2020  . HIV disease ([place]) 05/02/2020  . Hemorrhoids 05/02/2020  . 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 Chronic pain of right ankle 05/02/2020  . Shortness of breath 01/01/2020  . Costochondritis 01/01/2020  . Right sided abdominal pain 01/01/2020  . Right-sided chest wall pain 01/01/2020  . Other insomnia 10/04/2019  . Fatigue 10/04/2019  . History of 2019 novel coronavirus disease (COVID-19) 06/06/2019  . History of asthma 03/03/2019  . Asthma 03/03/2019    Past Medical History:  Diagnosis Date  . Childhood asthma   . COVID-19     Family History  Problem Relation Age of Onset  . Sarcoidosis Mother   . Healthy Father   . Healthy Sister   . Healthy Sister   . Asthma Neg Hx   .  Colon cancer Neg Hx   . Esophageal cancer Neg Hx    History reviewed. No pertinent surgical history. Social History   Social History Narrative  . Not on file    There is no immunization history on file for this patient.   Objective: Vital Signs: BP 113/74 (BP Location: Right Arm, Patient Position: Sitting, Cuff Size: Normal)   Pulse (!) 56   Ht 5' 9.5" (1.765 m)   Wt 181 lb (82.1 kg)   BMI 26.35 kg/m    Physical Exam HENT:     Right Ear: External ear normal.     Left Ear: External ear normal.     Mouth/Throat:     Mouth: Mucous membranes are moist.     Pharynx: Oropharynx is clear.  Eyes:     Conjunctiva/sclera: Conjunctivae normal.  Neck:     Comments: Single firm, mobile, nontender right cervical lymph node Cardiovascular:     Rate and Rhythm: Normal rate and regular rhythm.  Pulmonary:     Effort: Pulmonary effort is normal.     Breath sounds: Normal breath sounds.  Skin:    General: Skin is warm and dry.     Findings: No rash.     Comments: Normal nailfold capillaroscopy  Neurological:     General: No focal deficit present.     Mental Status: He is alert.  Psychiatric:        Mood and Affect: Mood normal.     Musculoskeletal Exam:  Neck full ROM no tenderness Shoulders full ROM no tenderness or swelling Elbows full ROM no tenderness or swelling Wrists full ROM no tenderness or swelling Fingers full ROM no tenderness or swelling Knees full ROM no tenderness or swelling Ankles full ROM no swelling, tenderness at the talonavicular joint and slightly posterior with palpation and inversion ROM MTPs full ROM no tenderness or swelling  Investigation: No additional findings.  Imaging: No results found.  Recent Labs: Lab Results  Component Value Date   WBC 3.6 (L) 10/21/2020   HGB 14.2 10/21/2020   PLT 291.0 10/21/2020   NA 137 10/21/2020   K 4.3 10/21/2020   CL 102 10/21/2020   CO2 27 10/21/2020   GLUCOSE 84 10/21/2020   BUN 18 10/21/2020    CREATININE 1.26 10/21/2020   BILITOT 0.4 10/21/2020   ALKPHOS 72 10/21/2020   AST 25 10/21/2020   ALT 18 10/21/2020   PROT 7.8 10/21/2020   ALBUMIN 4.7 10/21/2020   CALCIUM 9.7 10/21/2020   GFRAA >60 02/22/2019    Speciality Comments: No specialty comments available.  Procedures:  No procedures performed Allergies: Patient has no known allergies.   Assessment / Plan:     Visit Diagnoses: Chronic pain of right ankle - Plan: Sedimentation rate, C-reactive protein, Uric acid  Location is unusual for inflammatory enthesitis located on lateral foot no swelling or pain in posterior tibial tendon. Does not appear typical for gout with persistent, mild to moderate severity not episodic. Could be related to previous injury although MRI suggested some kind of inflammation. Would also consider possibility of an HIV associated arthropathy this can cause seronegative joint inflammation usually self limited and improves with ongoing reconsititution. Repeating ESR, CRP markers today also uric acid level. If no findings other test would be to return if swelling  increases again for ankle aspiration for synovial fluid analysis.  Positive ANA (antinuclear antibody) - Plan: RNP Antibody, Anti-Smith antibody, Sjogrens syndrome-A extractable nuclear antibody, Sjogrens syndrome-B extractable nuclear antibody, Anti-DNA antibody, double-stranded, C3 and C4  Positive ANA but does not demonstrate the clinical criteria of SLE or related diseases. Will check ENA panel including RNP, Smith, SSA, SSB, dsDNA, complement C3 C4. If negative low suspicion of disease.  Orders: Orders Placed This Encounter  Procedures  . Sedimentation rate  . C-reactive protein  . Uric acid  . RNP Antibody  . Anti-Smith antibody  . Sjogrens syndrome-A extractable nuclear antibody  . Sjogrens syndrome-B extractable nuclear antibody  . Anti-DNA antibody, double-stranded  . C3 and C4   No orders of the defined types were placed in  this encounter.   Follow-Up Instructions: Return if symptoms worsen or fail to improve.   Collier Salina, MD  Note - This record has been created using [employer].  Chart creation errors have been sought, but may not always  have been located. Such creation errors do not reflect on  the standard of medical care.

## 2020-10-21 ENCOUNTER — Other Ambulatory Visit: Payer: Self-pay

## 2020-10-21 ENCOUNTER — Ambulatory Visit (INDEPENDENT_AMBULATORY_CARE_PROVIDER_SITE_OTHER): Payer: 59 | Admitting: Internal Medicine

## 2020-10-21 ENCOUNTER — Encounter: Payer: Self-pay | Admitting: Internal Medicine

## 2020-10-21 ENCOUNTER — Ambulatory Visit (INDEPENDENT_AMBULATORY_CARE_PROVIDER_SITE_OTHER): Payer: 59 | Admitting: Family Medicine

## 2020-10-21 ENCOUNTER — Encounter: Payer: Self-pay | Admitting: Family Medicine

## 2020-10-21 VITALS — BP 113/74 | HR 56 | Ht 69.5 in | Wt 181.0 lb

## 2020-10-21 VITALS — BP 118/78 | HR 60 | Temp 98.2°F | Wt 184.6 lb

## 2020-10-21 DIAGNOSIS — R768 Other specified abnormal immunological findings in serum: Secondary | ICD-10-CM | POA: Diagnosis not present

## 2020-10-21 DIAGNOSIS — B20 Human immunodeficiency virus [HIV] disease: Secondary | ICD-10-CM

## 2020-10-21 DIAGNOSIS — G8929 Other chronic pain: Secondary | ICD-10-CM | POA: Diagnosis not present

## 2020-10-21 DIAGNOSIS — E559 Vitamin D deficiency, unspecified: Secondary | ICD-10-CM | POA: Diagnosis not present

## 2020-10-21 DIAGNOSIS — M25571 Pain in right ankle and joints of right foot: Secondary | ICD-10-CM | POA: Diagnosis not present

## 2020-10-21 DIAGNOSIS — E538 Deficiency of other specified B group vitamins: Secondary | ICD-10-CM

## 2020-10-21 LAB — CBC WITH DIFFERENTIAL/PLATELET
Basophils Absolute: 0 10*3/uL (ref 0.0–0.1)
Basophils Relative: 1.1 % (ref 0.0–3.0)
Eosinophils Absolute: 0.3 10*3/uL (ref 0.0–0.7)
Eosinophils Relative: 7.8 % — ABNORMAL HIGH (ref 0.0–5.0)
HCT: 42.6 % (ref 39.0–52.0)
Hemoglobin: 14.2 g/dL (ref 13.0–17.0)
Lymphocytes Relative: 34.8 % (ref 12.0–46.0)
Lymphs Abs: 1.2 10*3/uL (ref 0.7–4.0)
MCHC: 33.4 g/dL (ref 30.0–36.0)
MCV: 93.7 fl (ref 78.0–100.0)
Monocytes Absolute: 0.4 10*3/uL (ref 0.1–1.0)
Monocytes Relative: 11.8 % (ref 3.0–12.0)
Neutro Abs: 1.6 10*3/uL (ref 1.4–7.7)
Neutrophils Relative %: 44.5 % (ref 43.0–77.0)
Platelets: 291 10*3/uL (ref 150.0–400.0)
RBC: 4.55 Mil/uL (ref 4.22–5.81)
RDW: 13.9 % (ref 11.5–15.5)
WBC: 3.6 10*3/uL — ABNORMAL LOW (ref 4.0–10.5)

## 2020-10-21 LAB — COMPREHENSIVE METABOLIC PANEL
ALT: 18 U/L (ref 0–53)
AST: 25 U/L (ref 0–37)
Albumin: 4.7 g/dL (ref 3.5–5.2)
Alkaline Phosphatase: 72 U/L (ref 39–117)
BUN: 18 mg/dL (ref 6–23)
CO2: 27 mEq/L (ref 19–32)
Calcium: 9.7 mg/dL (ref 8.4–10.5)
Chloride: 102 mEq/L (ref 96–112)
Creatinine, Ser: 1.26 mg/dL (ref 0.40–1.50)
GFR: 77.32 mL/min (ref >60.00)
Glucose, Bld: 84 mg/dL (ref 70–99)
Potassium: 4.3 mEq/L (ref 3.5–5.1)
Sodium: 137 mEq/L (ref 135–145)
Total Bilirubin: 0.4 mg/dL (ref 0.2–1.2)
Total Protein: 7.8 g/dL (ref 6.0–8.3)

## 2020-10-21 LAB — VITAMIN D 25 HYDROXY (VIT D DEFICIENCY, FRACTURES): VITD: 21.68 ng/mL — ABNORMAL LOW (ref 30.00–100.00)

## 2020-10-21 LAB — VITAMIN B12: Vitamin B-12: 273 pg/mL (ref 211–911)

## 2020-10-21 NOTE — Patient Instructions (Signed)
Vitamin B12 Deficiency Vitamin B12 deficiency occurs when the body does not have enough vitamin B12, which is an important vitamin. The body needs this vitamin:  To make red blood cells.  To make DNA. This is the genetic material inside cells.  To help the nerves work properly so they can carry messages from the brain to the body. Vitamin B12 deficiency can cause various health problems, such as a low red blood cell count (anemia) or nerve damage. What are the causes? This condition may be caused by:  Not eating enough foods that contain vitamin B12.  Not having enough stomach acid and digestive fluids to properly absorb vitamin B12 from the food that you eat.  Certain digestive system diseases that make it hard to absorb vitamin B12. These diseases include Crohn's disease, chronic pancreatitis, and cystic fibrosis.  A condition in which the body does not make enough of a protein (intrinsic factor), resulting in too few red blood cells (pernicious anemia).  Having a surgery in which part of the stomach or small intestine is removed.  Taking certain medicines that make it hard for the body to absorb vitamin B12. These medicines include: ? Heartburn medicines (antacids and proton pump inhibitors). ? Certain antibiotic medicines. ? Some medicines that are used to treat diabetes, tuberculosis, gout, or high cholesterol. What increases the risk? The following factors may make you more likely to develop a B12 deficiency:  Being older than age 50.  Eating a vegetarian or vegan diet, especially while you are pregnant.  Eating a poor diet while you are pregnant.  Taking certain medicines.  Having alcoholism. What are the signs or symptoms? In some cases, there are no symptoms of this condition. If the condition leads to anemia or nerve damage, various symptoms can occur, such as:  Weakness.  Fatigue.  Loss of appetite.  Weight loss.  Numbness or tingling in your hands and  feet.  Redness and burning of the tongue.  Confusion or memory problems.  Depression.  Sensory problems, such as color blindness, ringing in the ears, or loss of taste.  Diarrhea or constipation.  Trouble walking. If anemia is severe, symptoms can include:  Shortness of breath.  Dizziness.  Rapid heart rate (tachycardia). How is this diagnosed? This condition may be diagnosed with a blood test to measure the level of vitamin B12 in your blood. You may also have other tests, including:  A group of tests that measure certain characteristics of blood cells (complete blood count, CBC).  A blood test to measure intrinsic factor.  A procedure where a thin tube with a camera on the end is used to look into your stomach or intestines (endoscopy). Other tests may be needed to discover the cause of B12 deficiency. How is this treated? Treatment for this condition depends on the cause. This condition may be treated by:  Changing your eating and drinking habits, such as: ? Eating more foods that contain vitamin B12. ? Drinking less alcohol or no alcohol.  Getting vitamin B12 injections.  Taking vitamin B12 supplements. Your health care provider will tell you which dosage is best for you. Follow these instructions at home: Eating and drinking  Eat lots of healthy foods that contain vitamin B12, including: ? Meats and poultry. This includes beef, pork, chicken, turkey, and organ meats, such as liver. ? Seafood. This includes clams, rainbow trout, salmon, tuna, and haddock. ? Eggs. ? Cereal and dairy products that are fortified. This means that vitamin B12 has   been added to the food. Check the label on the package to see if the food is fortified. The items listed above may not be a complete list of recommended foods and beverages. Contact a dietitian for more information.   General instructions  Get any injections that are prescribed by your health care provider.  Take  supplements only as told by your health care provider. Follow the directions carefully.  Do not drink alcohol if your health care provider tells you not to. In some cases, you may only be asked to limit alcohol use.  Keep all follow-up visits as told by your health care provider. This is important. Contact a health care provider if:  Your symptoms come back. Get help right away if you:  Develop shortness of breath.  Have a rapid heart rate.  Have chest pain.  Become dizzy or lose consciousness. Summary  Vitamin B12 deficiency occurs when the body does not have enough vitamin B12.  The main causes of vitamin B12 deficiency include dietary deficiency, digestive diseases, pernicious anemia, and having a surgery in which part of the stomach or small intestine is removed.  In some cases, there are no symptoms of this condition. If the condition leads to anemia or nerve damage, various symptoms can occur, such as weakness, shortness of breath, and numbness.  Treatment may include getting vitamin B12 injections or taking vitamin B12 supplements. Eat lots of healthy foods that contain vitamin B12. This information is not intended to replace advice given to you by your health care provider. Make sure you discuss any questions you have with your health care provider. Document Revised: 12/30/2018 Document Reviewed: 03/22/2018 Elsevier Patient Education  2021 [employer]  Vitamin D Deficiency Vitamin D deficiency is when your body does not have enough vitamin D. Vitamin D is important to your body for many reasons:  It helps the body absorb two important minerals--calcium and phosphorus.  It plays a role in bone health.  It may help to prevent some diseases, such as diabetes and multiple sclerosis.  It plays a role in muscle function, including heart function. If vitamin D deficiency is severe, it can cause a condition in which your bones become soft. In adults, this condition is  called osteomalacia. In children, this condition is called rickets. What are the causes? This condition may be caused by:  Not eating enough foods that contain vitamin D.  Not getting enough natural sun exposure.  Having certain digestive system diseases that make it difficult for your body to absorb vitamin D. These diseases include Crohn's disease, chronic pancreatitis, and cystic fibrosis.  Having a surgery in which a part of the stomach or a part of the small intestine is removed.  Having chronic kidney disease or liver disease. What increases the risk? You are more likely to develop this condition if you:  Are older.  Do not spend much time outdoors.  Live in a long-term care facility.  Have had broken bones.  Have weak or thin bones (osteoporosis).  Have a disease or condition that changes how the body absorbs vitamin D.  Have dark skin.  Take certain medicines, such as steroid medicines or certain seizure medicines.  Are overweight or obese. What are the signs or symptoms? In mild cases of vitamin D deficiency, there may not be any symptoms. If the condition is severe, symptoms may include:  Bone pain.  Muscle pain.  Falling often.  Broken bones caused by a minor injury. How is this  diagnosed? This condition may be diagnosed with blood tests. Imaging tests such as X-rays may also be done to look for changes in the bone. How is this treated? Treatment for this condition may depend on what caused the condition. Treatment options include:  Taking vitamin D supplements. Your health care provider will suggest what dose is best for you.  Taking a calcium supplement. Your health care provider will suggest what dose is best for you. Follow these instructions at home: Eating and drinking  Eat foods that contain vitamin D. Choices include: ? Fortified dairy products, cereals, or juices. Fortified means that vitamin D has been added to the food. Check the label on  the package to see if the food is fortified. ? Fatty fish, such as salmon or trout. ? Eggs. ? Oysters. ? Mushrooms. The items listed above may not be a complete list of recommended foods and beverages. Contact a dietitian for more information.   General instructions  Take medicines and supplements only as told by your health care provider.  Get regular, safe exposure to natural sunlight.  Do not use a tanning bed.  Maintain a healthy weight. Lose weight if needed.  Keep all follow-up visits as told by your health care provider. This is important. How is this prevented? You can get vitamin D by:  Eating foods that naturally contain vitamin D.  Eating or drinking products that have been fortified with vitamin D, such as cereals, juices, and dairy products (including milk).  Taking a vitamin D supplement or a multivitamin supplement that contains vitamin D.  Being in the sun. Your body naturally makes vitamin D when your skin is exposed to sunlight. Your body changes the sunlight into a form of the vitamin that it can use. Contact a health care provider if:  Your symptoms do not go away.  You feel nauseous or you vomit.  You have fewer bowel movements than usual or are constipated. Summary  Vitamin D deficiency is when your body does not have enough vitamin D.  Vitamin D is important to your body for good bone health and muscle function, and it may help prevent some diseases.  Vitamin D deficiency is primarily treated through supplementation. Your health care provider will suggest what dose is best for you.  You can get vitamin D by eating foods that contain vitamin D, by being in the sun, and by taking a vitamin D supplement or a multivitamin supplement that contains vitamin D. This information is not intended to replace advice given to you by your health care provider. Make sure you discuss any questions you have with your health care provider. Document Revised: 03/21/2018  Document Reviewed: 03/21/2018 Elsevier Patient Education  2021 [employer].

## 2020-10-21 NOTE — Progress Notes (Signed)
Subjective:    Patient ID: Tim Delgado, male    DOB: 1991-06-10, 30 y.o.   MRN: [id number]  Chief Complaint  Patient presents with  . Follow-up    HPI Patient was seen today for follow-up.  Patient states he is doing well overall.  Still having intermittent right ankle pain.  Has upcoming appointment with rheumatologist for this.  Patient states not currently on medication for but notes muscle relaxer seems to help.  Patient states he wants labs checked.  Considering purchasing injectable vitamins as he hears this may help more than p.o. vitamins.  Patient is in the process of moving to [place] for work.  Past Medical History:  Diagnosis Date  . Childhood asthma   . COVID-19     No Known Allergies  ROS General: Denies fever, chills, night sweats, changes in weight, changes in appetite HEENT: Denies headaches, ear pain, changes in vision, rhinorrhea, sore throat CV: Denies CP, palpitations, SOB, orthopnea Pulm: Denies SOB, cough, wheezing GI: Denies abdominal pain, nausea, vomiting, diarrhea, constipation GU: Denies dysuria, hematuria, frequency Msk: Denies muscle cramps, joint pains Neuro: Denies weakness, numbness, tingling Skin: Denies rashes, bruising Psych: Denies depression, anxiety, hallucinations     Objective:    Blood pressure 118/78, pulse 60, temperature 98.2 F (36.8 C), temperature source Oral, weight 184 lb 9.6 oz (83.7 kg), SpO2 97 %.  Gen. Pleasant, well-nourished, in no distress, normal affect   HEENT: [place]/AT, face symmetric, conjunctiva clear, no scleral icterus, PERRLA, EOMI, nares patent without drainage Lungs: no accessory muscle use Cardiovascular: RRR, no peripheral edema Musculoskeletal: No deformities, no cyanosis or clubbing, normal tone Neuro:  A&Ox3, CN II-XII intact, normal gait Skin:  Warm, no lesions/ rash   Wt Readings from Last 3 Encounters:  10/21/20 184 lb 9.6 oz (83.7 kg)  04/29/20 182 lb (82.6 kg)  01/04/20 173 lb 6 oz (78.6 kg)     Lab Results  Component Value Date   WBC 7.6 01/04/2020   HGB 13.2 01/04/2020   HCT 40.2 01/04/2020   PLT 350.0 01/04/2020   GLUCOSE 93 01/04/2020   CHOL 125 10/04/2019   TRIG 119.0 10/04/2019   HDL 26.80 (L) 10/04/2019   LDLCALC 75 10/04/2019   ALT 11 01/04/2020   AST 19 01/04/2020   NA 136 01/04/2020   K 3.8 01/04/2020   CL 102 01/04/2020   CREATININE 0.95 01/04/2020   BUN 5 (L) 01/04/2020   CO2 27 01/04/2020   TSH 2.73 10/04/2019   HGBA1C 5.1 10/04/2019    Assessment/Plan:  Vitamin B 12 deficiency  -Vitamin B12 106 on 10/04/2019 -s/p cyanocobalamin IM. -Recheck this visit - Plan: Vitamin B12  Vitamin D deficiency -Vitamin D 11.91 on 10/04/2019 - Plan: Vitamin D, 25-hydroxy  Chronic pain of right ankle -Supportive care -Encouraged to keep follow-up appointment with rheumatology - Plan: CBC with Differential/Platelet, CMP  HIV disease ([facility]) -Stable -Continue Biktarvy.  Patient advised certain vitamins (calcium) and supplements (iron) may affect levels of Biktarvy. -Continue follow-up with ID  F/u prn  Abbe Amsterdam, MD

## 2020-10-21 NOTE — Patient Instructions (Addendum)
I am checking lab tests to follow up the positive ANA result but I do not suspect an underlying systemic disease such as lupus or rheumatoid arthritis at this time. I am also repeating the inflammatory markers ESR and CRP for evidence of active inflammation. For now I agree with continuing meloxicam as antiinflammatory treatment.  If results are not positive or specific we might also benefit with seeing you back if symptoms become more severe again and trying to aspirate any fluid if possible.   Antinuclear Antibody Test Why am I having this test? This is a test that is used to help diagnose systemic lupus erythematosus (SLE) and other autoimmune diseases. An autoimmune disease is a disease in which the body's own defense (immune)system attacks its organs. What is being tested? This test checks for antinuclear antibodies (ANA) in the blood. The presence of ANA is associated with several autoimmune diseases. It is seen in almost all patients with lupus. What kind of sample is taken? A blood sample is required for this test. It is usually collected by inserting a needle into a blood vessel.   How are the results reported? Your test results will be reported as either positive or negative. A false-positive result can occur. A false positive is incorrect because it means that a condition is present when it is not. What do the results mean? A positive test result may mean that you have:  Lupus.  Other autoimmune diseases, such as rheumatoid arthritis, scleroderma, or Sjgren syndrome. Conditions that may cause a false-positive result include:  Liver dysfunction.  Myasthenia gravis.  Infectious mononucleosis. Talk with your health care provider about what your results mean. Questions to ask your health care provider Ask your health care provider, or the department that is doing the test:  When will my results be ready?  How will I get my results?  What are my treatment options?  What  other tests do I need?  What are my next steps? Summary  This is a test that is used to help diagnose systemic lupus erythematosus (SLE) and other autoimmune diseases. An autoimmune disease is a disease in which the body's own defense (immune)system attacks the body.  This test checks for antinuclear antibodies (ANA) in the blood. The presence of ANA is associated with several autoimmune diseases. It is seen in almost all patients with lupus.  Your test results will be reported as either positive or negative. Talk with your health care provider about what your results mean.    Erythrocyte Sedimentation Rate Test Why am I having this test? The erythrocyte sedimentation rate (ESR) test is used to help find illnesses related to:  Sudden (acute) or long-term (chronic) infections.  Inflammation.  The body's disease-fighting system attacking healthy cells (autoimmune diseases).  Cancer.  Tissue death. If you have symptoms that may be related to any of these illnesses, your health care provider may do an ESR test before doing more specific tests. If you have an inflammatory immune disease, such as rheumatoid arthritis, you may have this test to help monitor your therapy. What is being tested? This test measures how long it takes for your red blood cells (erythrocytes) to settle in a solution over a certain amount of time (sedimentation rate). When you have an infection or inflammation, your red blood cells clump together and settle faster. The sedimentation rate provides information about how much inflammation is present in the body. What kind of sample is taken? A blood sample is required for this  test. It is usually collected by inserting a needle into a blood vessel.   How do I prepare for this test? Follow any instructions from your health care provider about changing or stopping your regular medicines. Tell a health care provider about:  Any allergies you have.  All medicines you  are taking, including vitamins, herbs, eye drops, creams, and over-the-counter medicines.  Any blood disorders you have.  Any surgeries you have had.  Any medical conditions you have, such as thyroid or kidney disease.  Whether you are pregnant or may be pregnant. How are the results reported? Your results will be reported as a value that measures sedimentation rate in millimeters per hour (mm/hr). Your health care provider will compare your results to normal ranges that were established after testing a large group of people (reference values). Reference values may vary among labs and hospitals. For this test, common reference values, which vary by age and gender, are:  Newborn: 0-2 mm/hr.  Child, up to puberty: 0-10 mm/hr.  Male: ? Under 50 years: 0-20 mm/hr. ? 50-85 years: 0-30 mm/hr. ? Over 85 years: 0-42 mm/hr.  Male: ? Under 50 years: 0-15 mm/hr. ? 50-85 years: 0-20 mm/hr. ? Over 85 years: 0-30 mm/hr. Certain conditions or medicines may cause ESR levels to be falsely lower or higher, such as:  Pregnancy.  Obesity.  Steroids, birth control pills, and blood thinners.  Thyroid or kidney disease. What do the results mean? Results that are within reference values are considered normal, meaning that the level of inflammation in your body is healthy. High ESR levels mean that there is inflammation in your body. You will have more tests to help make a diagnosis. Inflammation may result from many different conditions or injuries. Talk with your health care provider about what your results mean. Questions to ask your health care provider Ask your health care provider, or the department that is doing the test:  When will my results be ready?  How will I get my results?  What are my treatment options?  What other tests do I need?  What are my next steps? Summary  The erythrocyte sedimentation rate (ESR) test is used to help find illnesses associated with sudden (acute)  or long-term (chronic) infections, inflammation, autoimmune diseases, cancer, or tissue death.  If you have symptoms that may be related to any of these illnesses, your health care provider may do an ESR test before doing more specific tests. If you have an inflammatory immune disease, such as rheumatoid arthritis, you may have this test to help monitor your therapy.  This test measures how long it takes for your red blood cells (erythrocytes) to settle in a solution over a certain amount of time (sedimentation rate). This provides information about how much inflammation is present in the body.    Anti-DNA Antibody Test Why am I having this test? The anti-DNA antibody test helps with the diagnosis and follow-up of systemic lupus erythematosus (SLE). It is also used to monitor treatment of this condition as the antibody decreases with successful therapy. What is being tested? This test measures the amount of anti-DNA antibody in the blood. This antibody is found in 65-80% of patients with active SLE. This antibody is not as common in patients who have other diseases. What kind of sample is taken? A blood sample is required for this test. It is usually collected by inserting a needle into a blood vessel.   How are the results reported? Your test results  will be reported as a value. Your test results may also be reported as positive, intermediate, or negative. Your health care provider will compare your results to normal ranges that were established after testing a large group of people (reference values). Reference values may vary among labs and hospitals. For this test, common reference values are:  Positive: 10 or more international units/mL.  Intermediate: 5-9 international units/mL.  Negative: Less than 5 international units/mL. What do the results mean? Positive results, which are associated with results that are higher than the reference values, may indicate:  Autoimmune disorders such  as SLE.  Infectious mononucleosis.  Chronic liver conditions. Intermediate results mean that the anti-DNA antibody levels are higher than normal, but not high enough to be considered positive. Negative results mean that you do not have the anti-DNA antibody that is associated with these conditions. Talk with your health care provider about what your results mean. Questions to ask your health care provider Ask your health care provider, or the department that is doing the test:  When will my results be ready?  How will I get my results?  What are my treatment options?  What other tests do I need?  What are my next steps? Summary  The anti-DNA antibody test helps with the diagnosis and follow-up of systemic lupus erythematosus (SLE). It is also used to monitor treatment of this condition as the antibody decreases with successful therapy.  This test measures the amount of anti-DNA antibody in the blood.  Elevated levels of anti-DNA antibody can be seen in patients with SLE and certain other conditions.    Complement Assay Test Why am I having this test? Complement refers to a group of proteins that are part of the body's disease-fighting system (immune system). A complement assay test provides information about some or all of these proteins. You may have this test:  To diagnose a lack, or deficiency, of certain complement proteins. Deficiencies can be passed from parent to child (inherited).  To monitor an infection or autoimmune disease.  If you have unexplained inflammation or swelling (edema).  If you have bacterial infections again and again. What is being tested? This test can be used to measure:  Total complement. This is the total number of protein complements in your blood.  The number of each kind of complement in your blood. The nine main kinds of complement are labeled C1 through C9. Some of these complements, such as C3 and C4, are especially important and have  many functions in the body. Depending on why you are having the test, your health care provider may test your total complement or only some individual complements, such as C3 and C4. The total complement assay test may be done before individual complements are tested. What kind of sample is taken? A blood sample is required for this test. It is usually collected by inserting a needle into a blood vessel.   Tell a health care provider about:  Any allergies you have.  All medicines you are taking, including vitamins, herbs, eye drops, creams, and over-the-counter medicines.  Any blood disorders you have.  Any surgeries you have had.  Any medical conditions you have.  Whether you are pregnant or may be pregnant. How are the results reported? Your results will be reported as a value that tells you how much complement is in your blood. This will be given as units per milliliter of blood (units/mL) or as milligrams per deciliter of blood (mg/dL). Your results may  be reported as total complement, or as individual complements, or both. Your health care provider will compare your results to normal ranges that were established after testing a large group of people (reference ranges). Reference ranges may vary among labs and hospitals. For this test, reference ranges for some of the most commonly measured complement assays may be:  Total complement: 30-75 units/mL.  C2: 1-4 mg/dL.  C3: 75-175 mg/dL.  C4: 22-45 units/mL. What do the results mean? Results within reference ranges are considered normal, which means you have a normal amount of complement in your blood. Results that are higher than the reference ranges may be caused by:  Inflammatory disease.  Heart attack.  Cancer. Complement deficiencies, or results lower than the reference ranges, may be caused by:  Certain inherited conditions.  Autoimmune disease.  Certain liver diseases.  Malnutrition.  Certain types of anemia  that result in breakdown of red blood cells (hemolytic anemia). Talk with your health care provider about what your results mean. Questions to ask your health care provider Ask your health care provider, or the department that is doing the test:  When will my results be ready?  How will I get my results?  What are my treatment options?  What other tests do I need?  What are my next steps? Summary  Complement refers to a group of proteins that are part of the body's disease-fighting system (immune system). A complement assay test can provide information about some or all of these proteins.  You may have a complement assay test to help diagnose a complement deficiency, and to monitor some infections or autoimmune disease.  Talk with your health care provider about what your results mean. This information is not intended to replace advice given to you by your health care provider. Make sure you discuss any questions you have with your health care provider. Document Revised: 05/09/2020 Document Reviewed: 05/09/2020 Elsevier Patient Education  2021 [employer].

## 2020-10-22 LAB — C3 AND C4
C3 Complement: 138 mg/dL (ref 82–185)
C4 Complement: 34 mg/dL (ref 15–53)

## 2020-10-22 LAB — SJOGRENS SYNDROME-B EXTRACTABLE NUCLEAR ANTIBODY: SSB (La) (ENA) Antibody, IgG: <1 AI

## 2020-10-22 LAB — SEDIMENTATION RATE: Sed Rate: 6 mm/h (ref 0–15)

## 2020-10-22 LAB — ANTI-SMITH ANTIBODY: ENA SM Ab Ser-aCnc: <1 AI

## 2020-10-22 LAB — C-REACTIVE PROTEIN: CRP: 6 mg/L (ref <8.0)

## 2020-10-22 LAB — RNP ANTIBODY: Ribonucleic Protein(ENA) Antibody, IgG: <1 AI

## 2020-10-22 LAB — SJOGRENS SYNDROME-A EXTRACTABLE NUCLEAR ANTIBODY: SSA (Ro) (ENA) Antibody, IgG: <1 AI

## 2020-10-22 LAB — URIC ACID: Uric Acid, Serum: 6.5 mg/dL (ref 4.0–8.0)

## 2020-10-22 LAB — ANTI-DNA ANTIBODY, DOUBLE-STRANDED: ds DNA Ab: <1 IU/mL

## 2020-10-22 NOTE — Progress Notes (Signed)
Lab tests are all negative for autoimmune disease related antibody tests. His inflammatory markers ESR and CRP are also down to normal levels, from being very high last year. I do not recommend any new treatments at this time. If his pain and swelling come back more severely I would recommend PRN visit to repeat exam and aspirate if there is any fluid collection.

## 2020-10-23 ENCOUNTER — Other Ambulatory Visit: Payer: Self-pay | Admitting: Family Medicine

## 2020-10-23 DIAGNOSIS — E559 Vitamin D deficiency, unspecified: Secondary | ICD-10-CM

## 2020-10-23 MED ORDER — VITAMIN D (ERGOCALCIFEROL) 1.25 MG (50000 UNIT) PO CAPS
50000.0000 [IU] | ORAL_CAPSULE | ORAL | 0 refills | Status: AC
Start: 2020-10-23 — End: ?

## 2020-10-23 NOTE — Progress Notes (Signed)
Left detailed message on vm.

## 2020-10-25 ENCOUNTER — Ambulatory Visit: Payer: 59 | Admitting: Internal Medicine

## 2021-01-02 ENCOUNTER — Encounter: Payer: Self-pay | Admitting: Podiatry

## 2021-01-03 ENCOUNTER — Other Ambulatory Visit: Payer: Self-pay | Admitting: Podiatry

## 2021-01-03 MED ORDER — MELOXICAM 15 MG PO TABS: 15.0000 mg | ORAL_TABLET | Freq: Every day | ORAL | 0 refills | Status: DC

## 2021-03-27 ENCOUNTER — Encounter: Payer: Self-pay | Admitting: Podiatry

## 2021-03-27 ENCOUNTER — Other Ambulatory Visit: Payer: Self-pay | Admitting: Podiatry

## 2021-03-27 MED ORDER — MELOXICAM 15 MG PO TABS: 15.0000 mg | ORAL_TABLET | Freq: Every day | ORAL | 0 refills | Status: DC

## 2021-04-02 ENCOUNTER — Encounter: Payer: 59 | Admitting: Family Medicine

## 2021-04-03 ENCOUNTER — Encounter: Payer: Self-pay | Admitting: Podiatry

## 2021-04-04 ENCOUNTER — Other Ambulatory Visit: Payer: Self-pay | Admitting: Podiatry

## 2021-04-04 ENCOUNTER — Encounter: Payer: Self-pay | Admitting: Family Medicine

## 2021-04-04 MED ORDER — MELOXICAM 15 MG PO TABS
15.0000 mg | ORAL_TABLET | Freq: Every day | ORAL | 0 refills | Status: DC
Start: 2021-04-04 — End: 2021-04-05

## 2021-04-10 ENCOUNTER — Telehealth: Payer: Self-pay | Admitting: *Deleted

## 2021-04-10 NOTE — Telephone Encounter (Signed)
Called and spoke with the pharmacy today and the pharmacist stated that the medicine was ready for the patient to come and pick up and I called the patient as well to let him know. Misty Stanley

## 2021-06-10 ENCOUNTER — Encounter: Payer: Self-pay | Admitting: Podiatry

## 2021-06-10 ENCOUNTER — Other Ambulatory Visit: Payer: Self-pay | Admitting: Podiatry

## 2021-06-10 MED ORDER — MELOXICAM 15 MG PO TABS: 15.0000 mg | ORAL_TABLET | Freq: Every day | ORAL | 0 refills | Status: AC

## 2021-07-01 ENCOUNTER — Telehealth: Payer: Self-pay | Admitting: *Deleted

## 2021-07-01 MED ORDER — MELOXICAM 15 MG PO TABS: 15.0000 mg | ORAL_TABLET | Freq: Every day | ORAL | 0 refills | Status: AC

## 2021-07-01 NOTE — Addendum Note (Signed)
Addended by: Hadley Pen R on: 07/01/2021 01:41 PM   Modules accepted: Orders

## 2021-07-01 NOTE — Telephone Encounter (Signed)
Called and spoke with the patient and stated to the patient did the patient ever get the 90 day supply of meloxicam 15 mg 90 day supply and patient stated that he did not and I stated that I would send over the 90 day supply and per Dr Ardelle Anton was ok to refill the prescription. Misty Stanley

## 2021-07-31 ENCOUNTER — Encounter: Payer: 59 | Admitting: Family Medicine

## 2021-09-07 IMAGING — CR DG CHEST 2V
2 series · 2 of 2 positions shown · non-contrast
Comparison: Single-view of the chest 02/22/2019.

CLINICAL DATA: Shortness of breath, dry cough and inferior right
chest pain since IMHGW-W4 infection in January 2019.

EXAM:
CHEST - 2 VIEW

[w chest pa]
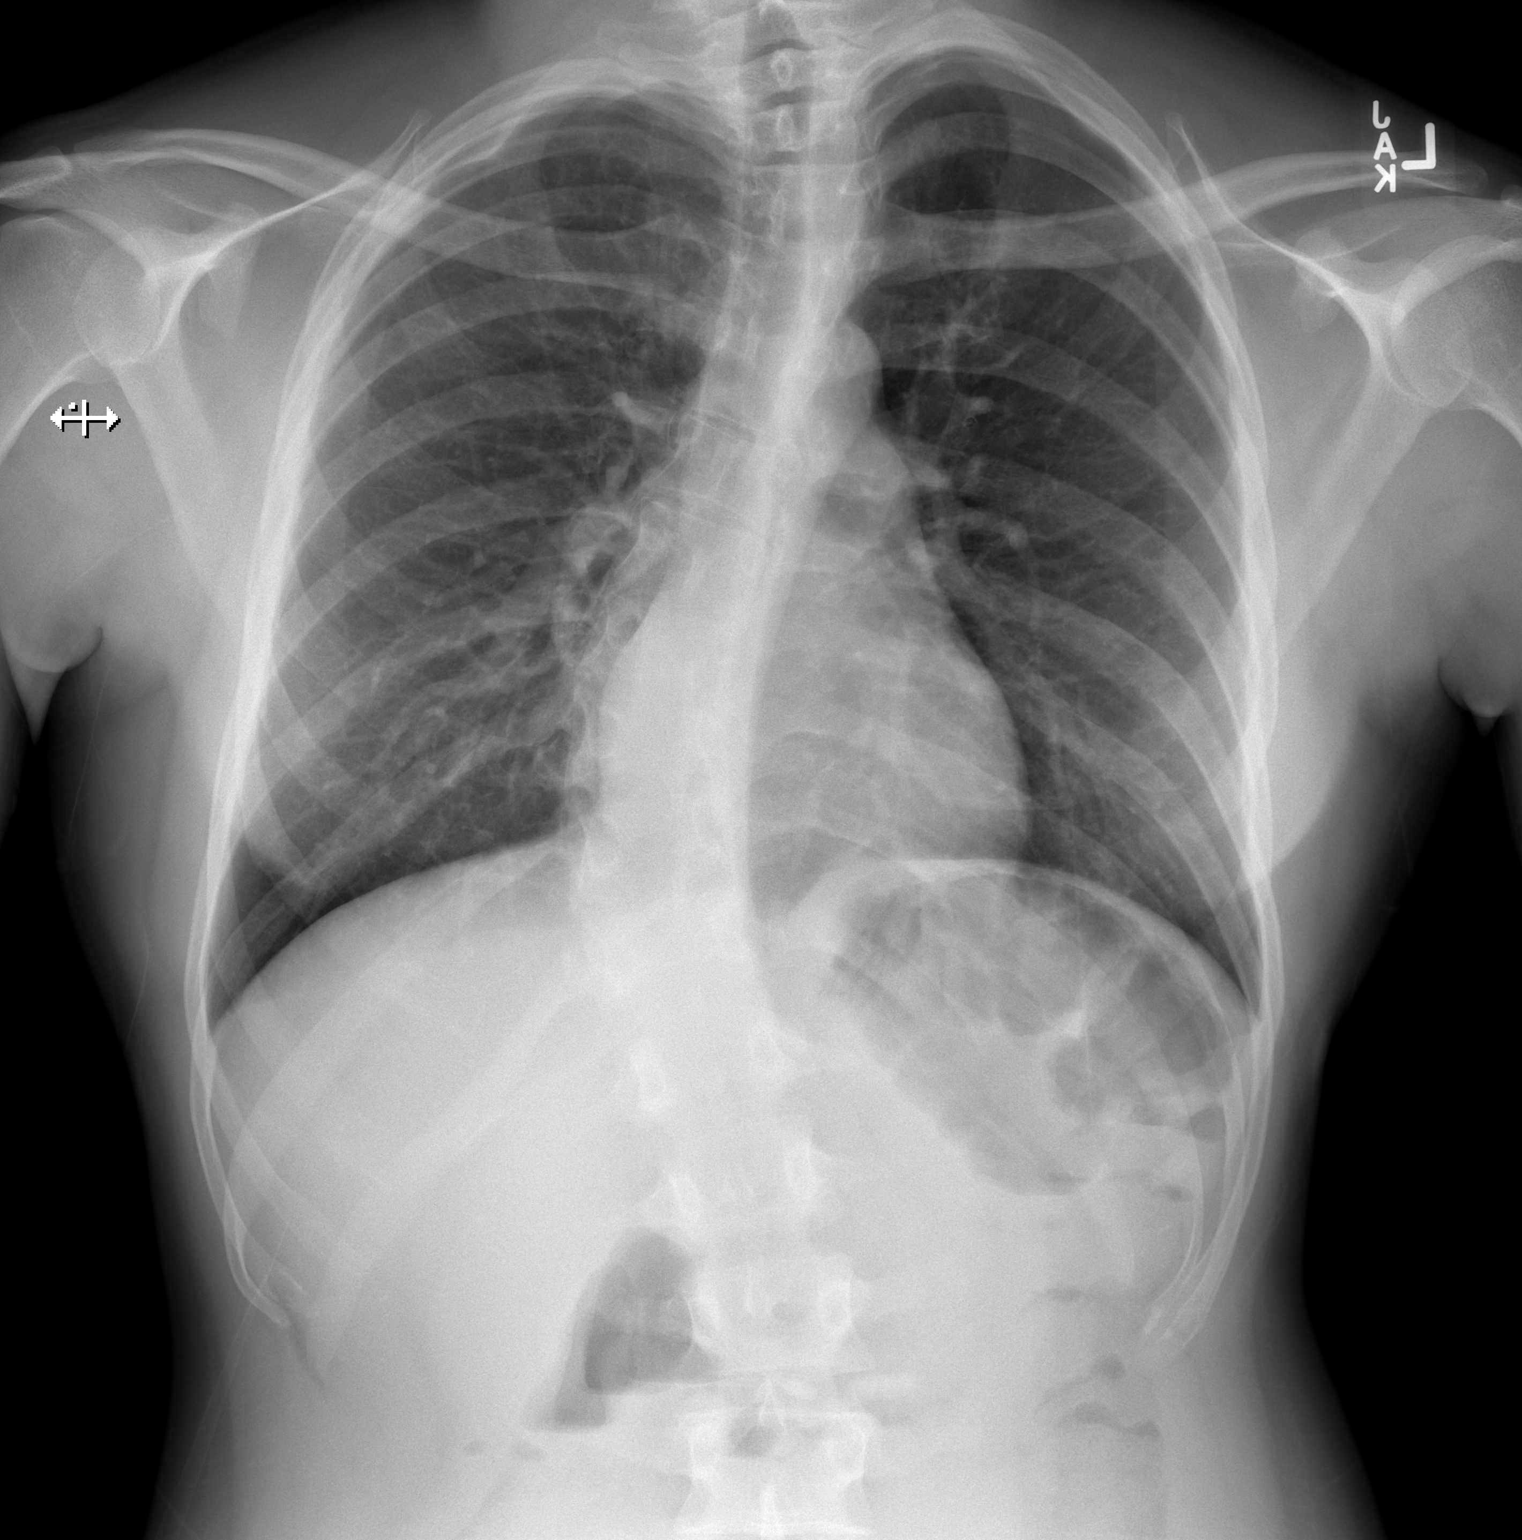

[w chest lat]
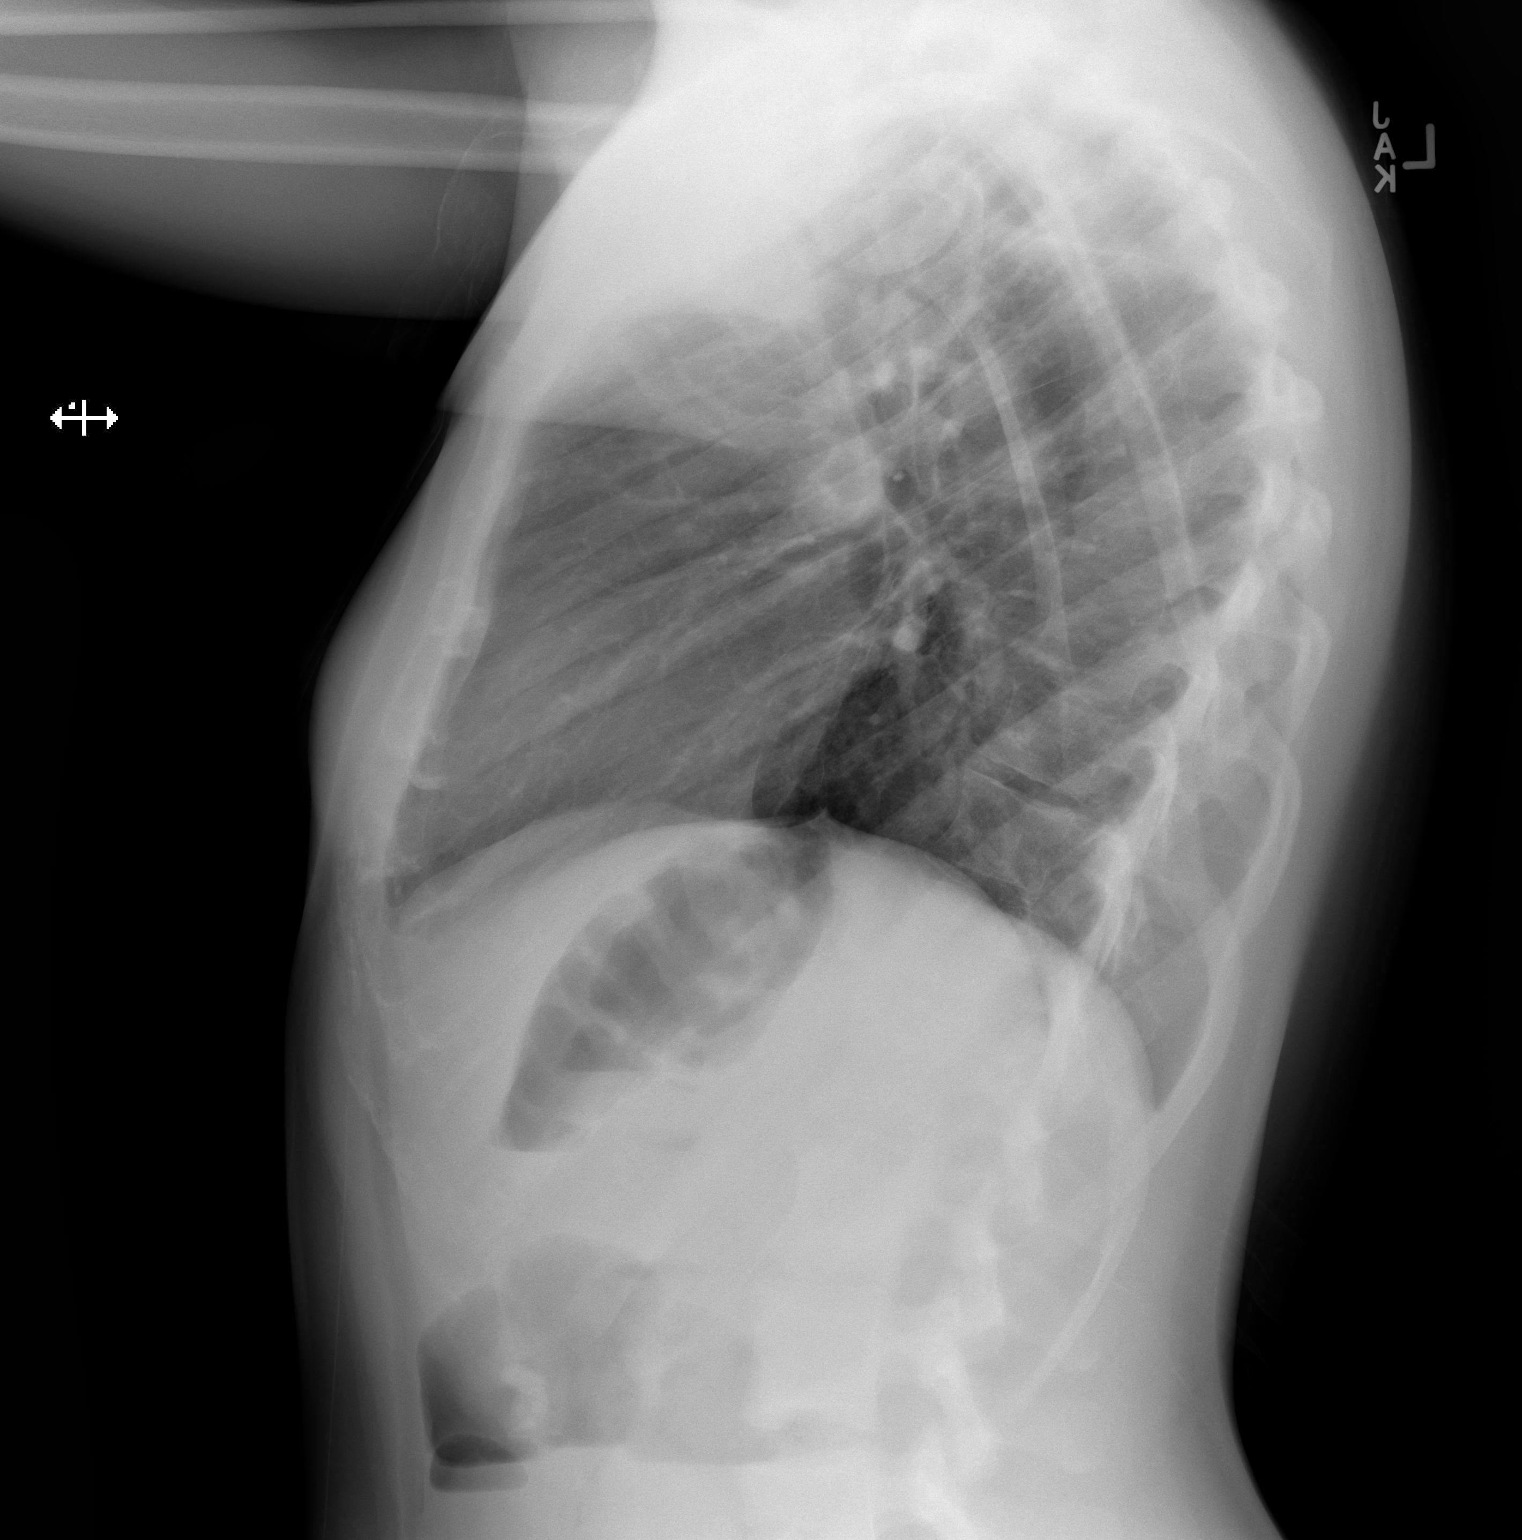

[2 of 2 positions shown; findings below may reference images not displayed]

FINDINGS: Lungs are clear. Heart size is normal. No pneumothorax or pleural
effusion. No acute bony abnormality. S-shaped scoliosis noted.
IMPRESSION: No acute disease.  Lungs are clear.

S shaped scoliosis.

## 2021-11-03 ENCOUNTER — Encounter: Payer: 59 | Admitting: Family Medicine

## 2021-11-12 ENCOUNTER — Other Ambulatory Visit: Payer: Self-pay | Admitting: Podiatry

## 2021-11-14 ENCOUNTER — Encounter: Payer: 59 | Admitting: Family Medicine

## 2022-03-09 ENCOUNTER — Encounter: Payer: 59 | Admitting: Family Medicine

## 2024-03-01 NOTE — Telephone Encounter (Signed)
 SABRA
